# Patient Record
Sex: Female | Born: 1950 | Race: White | Hispanic: No | Smoking: Current every day smoker
Health system: Southern US, Community
[De-identification: ages and names within clinical notes are randomized; demographics above are authoritative.]

## PROBLEM LIST (undated history)

## (undated) DIAGNOSIS — IMO0001 Reserved for inherently not codable concepts without codable children: Secondary | ICD-10-CM

## (undated) DIAGNOSIS — J449 Chronic obstructive pulmonary disease, unspecified: Secondary | ICD-10-CM

## (undated) DIAGNOSIS — F1010 Alcohol abuse, uncomplicated: Secondary | ICD-10-CM

## (undated) DIAGNOSIS — I5030 Unspecified diastolic (congestive) heart failure: Secondary | ICD-10-CM

## (undated) DIAGNOSIS — J40 Bronchitis, not specified as acute or chronic: Secondary | ICD-10-CM

## (undated) HISTORY — PX: CYST REMOVAL HAND: SHX6279

## (undated) HISTORY — PX: ABDOMINAL HYSTERECTOMY: SHX81

---

## 2005-07-08 ENCOUNTER — Emergency Department: Payer: Self-pay | Admitting: General Practice

## 2005-09-14 ENCOUNTER — Emergency Department: Payer: Self-pay | Admitting: Emergency Medicine

## 2006-01-10 ENCOUNTER — Inpatient Hospital Stay: Payer: Self-pay | Admitting: Psychiatry

## 2006-04-12 ENCOUNTER — Emergency Department: Payer: Self-pay | Admitting: Emergency Medicine

## 2006-06-12 ENCOUNTER — Inpatient Hospital Stay: Payer: Self-pay | Admitting: Internal Medicine

## 2006-08-07 ENCOUNTER — Inpatient Hospital Stay: Payer: Self-pay | Admitting: Unknown Physician Specialty

## 2007-05-07 ENCOUNTER — Emergency Department: Payer: Self-pay | Admitting: Emergency Medicine

## 2009-07-21 ENCOUNTER — Emergency Department: Payer: Self-pay | Admitting: Emergency Medicine

## 2010-07-22 ENCOUNTER — Emergency Department: Payer: Self-pay | Admitting: Emergency Medicine

## 2011-02-15 ENCOUNTER — Emergency Department: Payer: Self-pay | Admitting: Emergency Medicine

## 2012-05-26 ENCOUNTER — Emergency Department: Payer: Self-pay | Admitting: Unknown Physician Specialty

## 2015-12-06 ENCOUNTER — Encounter: Payer: Self-pay | Admitting: *Deleted

## 2015-12-06 ENCOUNTER — Emergency Department: Payer: Self-pay

## 2015-12-06 ENCOUNTER — Inpatient Hospital Stay
Admission: EM | Admit: 2015-12-06 | Discharge: 2015-12-07 | DRG: 189 | Disposition: A | Discharge status: Home / Self Care | Payer: Self-pay | Attending: Internal Medicine | Admitting: Internal Medicine

## 2015-12-06 DIAGNOSIS — E872 Acidosis, unspecified: Secondary | ICD-10-CM

## 2015-12-06 DIAGNOSIS — R1310 Dysphagia, unspecified: Secondary | ICD-10-CM | POA: Diagnosis present

## 2015-12-06 DIAGNOSIS — R7301 Impaired fasting glucose: Secondary | ICD-10-CM | POA: Diagnosis present

## 2015-12-06 DIAGNOSIS — F17210 Nicotine dependence, cigarettes, uncomplicated: Secondary | ICD-10-CM | POA: Diagnosis present

## 2015-12-06 DIAGNOSIS — Z9889 Other specified postprocedural states: Secondary | ICD-10-CM

## 2015-12-06 DIAGNOSIS — R0902 Hypoxemia: Secondary | ICD-10-CM

## 2015-12-06 DIAGNOSIS — Z808 Family history of malignant neoplasm of other organs or systems: Secondary | ICD-10-CM

## 2015-12-06 DIAGNOSIS — J9601 Acute respiratory failure with hypoxia: Principal | ICD-10-CM | POA: Diagnosis present

## 2015-12-06 DIAGNOSIS — J9602 Acute respiratory failure with hypercapnia: Secondary | ICD-10-CM | POA: Diagnosis present

## 2015-12-06 DIAGNOSIS — J441 Chronic obstructive pulmonary disease with (acute) exacerbation: Secondary | ICD-10-CM | POA: Diagnosis present

## 2015-12-06 DIAGNOSIS — R0689 Other abnormalities of breathing: Secondary | ICD-10-CM

## 2015-12-06 DIAGNOSIS — Z90710 Acquired absence of both cervix and uterus: Secondary | ICD-10-CM

## 2015-12-06 HISTORY — DX: Reserved for inherently not codable concepts without codable children: IMO0001

## 2015-12-06 HISTORY — DX: Chronic obstructive pulmonary disease, unspecified: J44.9

## 2015-12-06 HISTORY — DX: Bronchitis, not specified as acute or chronic: J40

## 2015-12-06 LAB — URINALYSIS COMPLETE WITH MICROSCOPIC (ARMC ONLY)
BACTERIA UA: NONE SEEN
Bilirubin Urine: NEGATIVE
GLUCOSE, UA: NEGATIVE mg/dL
Ketones, ur: NEGATIVE mg/dL
LEUKOCYTES UA: NEGATIVE
NITRITE: NEGATIVE
PH: 6 (ref 5.0–8.0)
PROTEIN: NEGATIVE mg/dL
Specific Gravity, Urine: >1.06 — ABNORMAL HIGH (ref 1.005–1.030)

## 2015-12-06 LAB — CBC WITH DIFFERENTIAL/PLATELET
BASOS ABS: 0.1 10*3/uL (ref 0–0.1)
BASOS PCT: 1 %
EOS PCT: 1 %
Eosinophils Absolute: 0.1 10*3/uL (ref 0–0.7)
HCT: 46 % (ref 35.0–47.0)
Hemoglobin: 15.3 g/dL (ref 12.0–16.0)
Lymphocytes Relative: 11 %
Lymphs Abs: 0.9 10*3/uL — ABNORMAL LOW (ref 1.0–3.6)
MCH: 36.1 pg — ABNORMAL HIGH (ref 26.0–34.0)
MCHC: 33.1 g/dL (ref 32.0–36.0)
MCV: 109 fL — ABNORMAL HIGH (ref 80.0–100.0)
MONO ABS: 0.7 10*3/uL (ref 0.2–0.9)
Monocytes Relative: 9 %
NEUTROS ABS: 6.1 10*3/uL (ref 1.4–6.5)
Neutrophils Relative %: 78 %
PLATELETS: 279 10*3/uL (ref 150–440)
RBC: 4.22 MIL/uL (ref 3.80–5.20)
RDW: 17.4 % — AB (ref 11.5–14.5)
WBC: 7.8 10*3/uL (ref 3.6–11.0)

## 2015-12-06 LAB — INFLUENZA PANEL BY PCR (TYPE A & B)
H1N1FLUPCR: NOT DETECTED
INFLBPCR: NEGATIVE
Influenza A By PCR: NEGATIVE

## 2015-12-06 LAB — BLOOD GAS, VENOUS
Acid-Base Excess: 1.2 mmol/L (ref 0.0–3.0)
Acid-Base Excess: 3.3 mmol/L — ABNORMAL HIGH (ref 0.0–3.0)
BICARBONATE: 30.5 meq/L — AB (ref 21.0–28.0)
Bicarbonate: 33.3 mEq/L — ABNORMAL HIGH (ref 21.0–28.0)
PATIENT TEMPERATURE: 37
PCO2 VEN: 68 mmHg — AB (ref 44.0–60.0)
Patient temperature: 37
pCO2, Ven: 76 mmHg (ref 44.0–60.0)
pH, Ven: 7.26 — ABNORMAL LOW (ref 7.320–7.430)
pH, Ven: 7.25 — ABNORMAL LOW (ref 7.320–7.430)
pO2, Ven: <31 mmHg — ABNORMAL LOW (ref 31.0–45.0)

## 2015-12-06 LAB — RAPID INFLUENZA A&B ANTIGENS (ARMC ONLY)
INFLUENZA A (ARMC): NEGATIVE
INFLUENZA B (ARMC): NEGATIVE

## 2015-12-06 LAB — COMPREHENSIVE METABOLIC PANEL
ALBUMIN: 3.2 g/dL — AB (ref 3.5–5.0)
ALT: 17 U/L (ref 14–54)
AST: 20 U/L (ref 15–41)
Alkaline Phosphatase: 297 U/L — ABNORMAL HIGH (ref 38–126)
Anion gap: 9 (ref 5–15)
BUN: 6 mg/dL (ref 6–20)
CHLORIDE: 103 mmol/L (ref 101–111)
CO2: 26 mmol/L (ref 22–32)
Calcium: 8.3 mg/dL — ABNORMAL LOW (ref 8.9–10.3)
Creatinine, Ser: 0.53 mg/dL (ref 0.44–1.00)
Glucose, Bld: 120 mg/dL — ABNORMAL HIGH (ref 65–99)
POTASSIUM: 3.6 mmol/L (ref 3.5–5.1)
Sodium: 138 mmol/L (ref 135–145)
Total Bilirubin: 0.4 mg/dL (ref 0.3–1.2)
Total Protein: 6.6 g/dL (ref 6.5–8.1)

## 2015-12-06 LAB — GLUCOSE, CAPILLARY
GLUCOSE-CAPILLARY: 130 mg/dL — AB (ref 65–99)
GLUCOSE-CAPILLARY: 179 mg/dL — AB (ref 65–99)
Glucose-Capillary: 197 mg/dL — ABNORMAL HIGH (ref 65–99)

## 2015-12-06 LAB — TROPONIN I

## 2015-12-06 LAB — BRAIN NATRIURETIC PEPTIDE: B Natriuretic Peptide: 139 pg/mL — ABNORMAL HIGH (ref 0.0–100.0)

## 2015-12-06 MED ORDER — METHYLPREDNISOLONE SODIUM SUCC 125 MG IJ SOLR
125.0000 mg | Freq: Once | INTRAMUSCULAR | Status: AC
  Administered 2015-12-06: 125 mg via INTRAVENOUS
  Filled 2015-12-06: qty 2

## 2015-12-06 MED ORDER — IPRATROPIUM-ALBUTEROL 0.5-2.5 (3) MG/3ML IN SOLN
3.0000 mL | Freq: Four times a day (QID) | RESPIRATORY_TRACT | Status: DC
  Administered 2015-12-06 – 2015-12-07 (×4): 3 mL via RESPIRATORY_TRACT
  Filled 2015-12-06 (×5): qty 3

## 2015-12-06 MED ORDER — NYSTATIN 100000 UNIT/ML MT SUSP
5.0000 mL | Freq: Four times a day (QID) | OROMUCOSAL | Status: DC
Start: 2015-12-06 — End: 2015-12-07
  Administered 2015-12-06 – 2015-12-07 (×4): 500000 [IU] via ORAL
  Filled 2015-12-06 (×4): qty 5

## 2015-12-06 MED ORDER — IOHEXOL 350 MG/ML SOLN
60.0000 mL | Freq: Once | INTRAVENOUS | Status: AC | PRN
  Administered 2015-12-06: 60 mL via INTRAVENOUS

## 2015-12-06 MED ORDER — AZITHROMYCIN 250 MG PO TABS
500.0000 mg | ORAL_TABLET | Freq: Every day | ORAL | Status: AC
  Administered 2015-12-06: 500 mg via ORAL
  Filled 2015-12-06: qty 2

## 2015-12-06 MED ORDER — INSULIN ASPART 100 UNIT/ML ~~LOC~~ SOLN: 0.0000 [IU] | Freq: Every day | SUBCUTANEOUS | Status: DC

## 2015-12-06 MED ORDER — SODIUM CHLORIDE 0.9 % IV BOLUS (SEPSIS)
1000.0000 mL | Freq: Once | INTRAVENOUS | Status: AC
  Administered 2015-12-06: 1000 mL via INTRAVENOUS

## 2015-12-06 MED ORDER — ACETAMINOPHEN 650 MG RE SUPP: 650.0000 mg | Freq: Four times a day (QID) | RECTAL | Status: DC | PRN

## 2015-12-06 MED ORDER — METHYLPREDNISOLONE SODIUM SUCC 125 MG IJ SOLR
60.0000 mg | Freq: Four times a day (QID) | INTRAMUSCULAR | Status: DC
  Administered 2015-12-06 – 2015-12-07 (×4): 60 mg via INTRAVENOUS
  Filled 2015-12-06 (×4): qty 2

## 2015-12-06 MED ORDER — INSULIN ASPART 100 UNIT/ML ~~LOC~~ SOLN
0.0000 [IU] | Freq: Three times a day (TID) | SUBCUTANEOUS | Status: DC
  Administered 2015-12-06: 2 [IU] via SUBCUTANEOUS
  Administered 2015-12-07 (×2): 3 [IU] via SUBCUTANEOUS
  Filled 2015-12-06: qty 1
  Filled 2015-12-06: qty 3
  Filled 2015-12-06: qty 2
  Filled 2015-12-06: qty 3

## 2015-12-06 MED ORDER — BUDESONIDE 0.25 MG/2ML IN SUSP
0.2500 mg | Freq: Two times a day (BID) | RESPIRATORY_TRACT | Status: DC
  Administered 2015-12-06 – 2015-12-07 (×2): 0.25 mg via RESPIRATORY_TRACT
  Filled 2015-12-06 (×2): qty 2

## 2015-12-06 MED ORDER — AZITHROMYCIN 250 MG PO TABS
250.0000 mg | ORAL_TABLET | Freq: Every day | ORAL | Status: DC
  Administered 2015-12-07: 250 mg via ORAL
  Filled 2015-12-06: qty 1

## 2015-12-06 MED ORDER — ENOXAPARIN SODIUM 40 MG/0.4ML ~~LOC~~ SOLN
40.0000 mg | SUBCUTANEOUS | Status: DC
  Administered 2015-12-06: 40 mg via SUBCUTANEOUS
  Filled 2015-12-06 (×2): qty 0.4

## 2015-12-06 MED ORDER — TRAMADOL HCL 50 MG PO TABS
50.0000 mg | ORAL_TABLET | Freq: Four times a day (QID) | ORAL | Status: DC | PRN
  Administered 2015-12-07: 50 mg via ORAL
  Filled 2015-12-06: qty 1

## 2015-12-06 MED ORDER — NICOTINE 21 MG/24HR TD PT24
21.0000 mg | MEDICATED_PATCH | Freq: Every day | TRANSDERMAL | Status: DC
Start: 2015-12-06 — End: 2015-12-07
  Administered 2015-12-06 – 2015-12-07 (×2): 21 mg via TRANSDERMAL
  Filled 2015-12-06 (×2): qty 1

## 2015-12-06 MED ORDER — IPRATROPIUM-ALBUTEROL 0.5-2.5 (3) MG/3ML IN SOLN
3.0000 mL | Freq: Once | RESPIRATORY_TRACT | Status: AC
  Administered 2015-12-06: 3 mL via RESPIRATORY_TRACT
  Filled 2015-12-06: qty 3

## 2015-12-06 MED ORDER — ACETAMINOPHEN 325 MG PO TABS
650.0000 mg | ORAL_TABLET | Freq: Four times a day (QID) | ORAL | Status: DC | PRN
  Administered 2015-12-06 – 2015-12-07 (×2): 650 mg via ORAL
  Filled 2015-12-06 (×2): qty 2

## 2015-12-06 NOTE — ED Provider Notes (Signed)
[facility] Emergency Department Provider Note  ____________________________________________  Time seen: Approximately 11:23 AM  I have reviewed the triage vital signs and the nursing notes.   HISTORY  Chief Complaint Shortness of Breath    HPI Rose Torres is a 65 y.o. female with a history of COPD and ongoing tobacco abuse presenting with 3-4 weeks of generalized weakness and shortness of breath. Patient reports that over the past several weeks she has as a progressively worsening exertional shortness of breath. She denies any associated cough, rhinorrhea, congestion, or ear pain. Several nights ago she had chills, but has not had any known fever. She occasionally also has episodes of bilateral chest "tightness" around the lateral chest walls that lasts for 2-3 hours and resolved with rest. No nausea, vomiting, diarrhea, abdominal pain, palpitations or syncope.   Past Medical History  Diagnosis Date  . COPD (chronic obstructive pulmonary disease) (HCC)   . Bronchitis     There are no active problems to display for this patient.   History reviewed. No pertinent past surgical history.  Current Outpatient Rx  Name  Route  Sig  Dispense  Refill  . ibuprofen (ADVIL,MOTRIN) 200 MG tablet   Oral   Take 200 mg by mouth every 6 (six) hours as needed.           Allergies Review of patient's allergies indicates no known allergies.  History reviewed. No pertinent family history.  Social History Social History  Substance Use Topics  . Smoking status: Current Every Day Smoker  . Smokeless tobacco: None  . Alcohol Use: None    Review of Systems Constitutional: Positive chills with no fever. No syncope. No lightheadedness. Also of generalized weakness. Eyes: No visual changes. No eye discharge. ENT: No sore throat. No congestion or rhinorrhea. Cardiovascular: Positive bilateral chest "tightness." No palpitations. Respiratory: Positive  exertional shortness of breath.  No cough. Gastrointestinal: No abdominal pain.  No nausea, no vomiting.  No diarrhea.  No constipation. Genitourinary: Negative for dysuria. Musculoskeletal: Negative for back pain. No calf pain or leg pain. No lower extremity swelling. Skin: Negative for rash. Neurological: Negative for headaches, focal weakness or numbness.  10-point ROS otherwise negative.  ____________________________________________   PHYSICAL EXAM:  VITAL SIGNS: ED Triage Vitals  Enc Vitals Group     BP 12/06/15 1100 94/47 mmHg     Pulse Rate 12/06/15 1100 99     Resp 12/06/15 1100 18     Temp 12/06/15 1100 98.1 F (36.7 C)     Temp Source 12/06/15 1100 Oral     SpO2 12/06/15 1100 84 %     Weight 12/06/15 1100 102 lb (46.267 kg)     Height 12/06/15 1100  (1.549 m)     Head Cir --      Peak Flow --      Pain Score 12/06/15 1101 7     Pain Loc --      Pain Edu? --      Excl. in GC? --     Constitutional: Patient is chronically ill appearing but alert and oriented and answering questions appropriately. She is mildly uncomfortable but in no acute distress. Nontoxic.  Eyes: Conjunctivae are normal.  EOMI. no eye discharge. No scleral icterus. Head: Atraumatic. Nose: No congestion/rhinnorhea. Mouth/Throat: Mucous membranes are moist.  Neck: No stridor.  Supple.  No JVD. No meningismus. Cardiovascular: Fast rate, regular rhythm. No murmurs, rubs or gallops.  Respiratory: Patient is tachypnea but able to  speak in 4-5 word sentences. Minimal end expiratory wheezing bilaterally which is best heard on the anterior auscultation exam. No rales or rhonchi. Increased expiratory phase. Positive bilateral breath sounds. Gastrointestinal: Soft and nontender. No distention. No peritoneal signs. Musculoskeletal: No LE edema. No calf tenderness or palpable cords. Negative Homans sign. Neurologic:  Normal speech and language. No gross focal neurologic deficits are appreciated.  Skin:   Skin is warm, dry and intact. No rash noted. Psychiatric: Mood and affect are normal. Speech and behavior are normal.  Normal judgement.  ____________________________________________   LABS (all labs ordered are listed, but only abnormal results are displayed)  Labs Reviewed  CBC WITH DIFFERENTIAL/PLATELET - Abnormal; Notable for the following:    MCV 109.0 (*)    MCH 36.1 (*)    RDW 17.4 (*)    Lymphs Abs 0.9 (*)    All other components within normal limits  COMPREHENSIVE METABOLIC PANEL - Abnormal; Notable for the following:    Glucose, Bld 120 (*)    Calcium 8.3 (*)    Albumin 3.2 (*)    Alkaline Phosphatase 297 (*)    All other components within normal limits  BLOOD GAS, VENOUS - Abnormal; Notable for the following:    pH, Ven 7.26 (*)    pCO2, Ven 68 (*)    pO2, Ven <31.0 (*)    Bicarbonate 30.5 (*)    All other components within normal limits  BRAIN NATRIURETIC PEPTIDE - Abnormal; Notable for the following:    B Natriuretic Peptide 139.0 (*)    All other components within normal limits  RAPID INFLUENZA A&B ANTIGENS ([facility] ONLY)  CULTURE, BLOOD (ROUTINE X 2)  CULTURE, BLOOD (ROUTINE X 2)  TROPONIN I  URINALYSIS COMPLETEWITH MICROSCOPIC ([facility] ONLY)   ____________________________________________  EKG  ED ECG REPORT I, Rockne Menghini, the attending physician, personally viewed and interpreted this ECG.   Date: 12/06/2015  EKG Time: 1129  Rate: 91  Rhythm: normal sinus rhythm  Axis: Normal  Intervals:none  ST&T Change: Nonspecific T-wave inversion in V1 and V2. No ST elevation. Positive PVC.  ____________________________________________  RADIOLOGY  Dg Chest 2 View  12/06/2015  CLINICAL DATA:  Shortness of breath.  COPD. EXAM: CHEST  2 VIEW COMPARISON:  02/15/2011 FINDINGS: The heart size and pulmonary vascularity are normal. No infiltrates or effusions. Chronic accentuation of the interstitial markings. The lungs are somewhat hyperinflated with  flattening of the diaphragm consistent with COPD. Slight calcification in the thoracic aorta. Bones are normal. IMPRESSION: Chronic lung disease.  No acute abnormalities. Electronically Signed   By: Francene Boyers M.D.   On: 12/06/2015 11:56   Ct Angio Chest Pe W/cm &/or Wo Cm  12/06/2015  CLINICAL DATA:  Progressive shortness of breath for 1 month, history of COPD, smoker EXAM: CT ANGIOGRAPHY CHEST WITH CONTRAST TECHNIQUE: Multidetector CT imaging of the chest was performed using the standard protocol during bolus administration of intravenous contrast. Multiplanar CT image reconstructions and MIPs were obtained to evaluate the vascular anatomy. CONTRAST:  60mL OMNIPAQUE IOHEXOL 350 MG/ML SOLN COMPARISON:  None. FINDINGS: Mediastinum/Lymph Nodes: Heart size within normal limits. No pericardial effusion. There is mild thickening of distal esophageal wall please see axial image 90. Gastroesophageal reflux disease cannot be excluded. The study is of excellent technical quality. There is no evidence of pulmonary embolus. No mediastinal hematoma or adenopathy. No aortic aneurysm. Mild atherosclerotic calcifications of thoracic aorta. There is no hilar adenopathy. Lungs/Pleura: Hyperinflation is noted. Mild emphysematous changes with centrilobular and paraseptal  emphysematous bullae bilateral upper lobe. No segmental infiltrate or pulmonary edema. No focal consolidation. Mild perihilar bronchitic changes are noted bilaterally. There is atelectasis or scarring left base anterolaterally. Small scarring noted in lingula. Mild emphysematous changes bilateral upper lobes scattered mild emphysematous changes bilateral lower lobes. There is no pleural thickening. No pleural effusion. Upper abdomen: Visualized liver shows no biliary ductal dilatation. Visualized tail of the pancreas is unremarkable. No adrenal gland mass is noted. Musculoskeletal: Sagittal images of the spine shows mild degenerative changes mid and lower  thoracic spine. Sagittal view of the sternum is unremarkable. Review of the MIP images confirms the above findings. IMPRESSION: 1. No pulmonary embolus is noted. 2. No mediastinal hematoma or adenopathy. 3. There is mild thickening of distal esophageal wall. Clinical correlation is necessary to exclude gastroesophageal reflux disease. 4. Hyperinflation is noted. Emphysematous changes are noted bilateral upper lobes. Minimal emphysematous changes are noted bilateral lower lobes. No segmental infiltrate or pulmonary edema. Mild perihilar bronchitic changes. Mild scarring lingula. Mild atelectasis or scarring in left base anterolaterally. Electronically Signed   By: Natasha Mead M.D.   On: 12/06/2015 12:57    ____________________________________________   PROCEDURES  Procedure(s) performed: None  Critical Care performed: Yes, see note ____________________________________________   INITIAL IMPRESSION / ASSESSMENT AND PLAN / ED COURSE  Pertinent labs & imaging results that were available during my care of the patient were reviewed by me and considered in my medical decision making (see chart for details).  65 y.o. female with COPD who does not wear oxygen at home presenting with an O2 sat as low as 78% with a good tracing. The patient also has an initial blood pressure which is hypotensive and associated tachycardia in the low 100s. She does not have a fever here and has not been having a fever at home. There are multiple possible etiologies for her symptoms. I would consider progression of her COPD or emphysema, and we will also evaluate for an acute infection although she has not been having a cough or fever. The patient has no signs or symptoms of DVT, but I would also be concerned for PE. A cardiac etiology is less likely given her risk factors, but we will also evaluate for ischemia, ACS or MI. The patient will require admission to the hospital after her initial evaluation in the emergency  department is complete.  ----------------------------------------- 12:53 PM on 12/06/2015 ----------------------------------------- The patient has a chest x-ray which shows no acute cardiopulmonary abnormalities. Her EKG does not show any ischemic changes. She does, however, have a gas which shows some mild acidemia with hypercarbia and hypoxia. Her BNP is is mildly elevated, but she has no evidence of pulmonary edema, or peripheral edema, and does not have any history of CHF. It is unlikely that this is driving her hypoxia. Her flu examinations are negative. She does not have an elevated white blood cell count, nor an infiltrate on her x-ray. At this time, I am awaiting the results of her CT for final disposition  CRITICAL CARE Performed by: Rockne Menghini   Total critical care time: 35 minutes  Critical care time was exclusive of separately billable procedures and treating other patients.  Critical care was necessary to treat or prevent imminent or life-threatening deterioration.  Critical care was time spent personally by me on the following activities: development of treatment plan with patient and/or surrogate as well as nursing, discussions with consultants, evaluation of patient's response to treatment, examination of patient, obtaining history from patient  or surrogate, ordering and performing treatments and interventions, ordering and review of laboratory studies, ordering and review of radiographic studies, pulse oximetry and re-evaluation of patient's condition.  ----------------------------------------- 1:09 PM on 12/06/2015 -----------------------------------------  The patient's CT scan does not show any evidence of pulmonary embolus, nor does she have any evidence of infiltrate. She does have diffuse emphysematous changes. I will plan to treat the patient for an acute COPD exacerbation with steroids and nebulizers. She will require hospitalization for her new hypoxia,  she has not required oxygen in the past.  ____________________________________________  FINAL CLINICAL IMPRESSION(S) / ED DIAGNOSES  Final diagnoses:  Hypoxia  Hypercarbia  Acidemia  COPD exacerbation (HCC)      NEW MEDICATIONS STARTED DURING THIS VISIT:  New Prescriptions   No medications on file     Rockne MenghiniAnne-Caroline Schwanda Zima, MD 12/06/15 1310

## 2015-12-06 NOTE — H&P (Signed)
[facility] - [place] at [facility]   PATIENT NAME: Marcille Barman    MR#:  [id number]  DATE OF BIRTH:  1951/03/02  DATE OF ADMISSION:  12/06/2015  PRIMARY CARE PHYSICIAN: [facility]  REQUESTING/REFERRING PHYSICIAN: Dr. Virgilio Frees  CHIEF COMPLAINT:   Chief Complaint  Patient presents with  . Shortness of Breath    HISTORY OF PRESENT ILLNESS:  Elisheba Mcdonnell  is a 65 y.o. female with a known history of COPD. She presents with 3 weeks of shortness of breath and fatigue. She states that she's been weaker and weaker and she can hardly walk across the room. She's been using her husband's albuterol without any improvement. Not much cough or wheeze. She does have rib soreness bilaterally. She's also had some recent dysphagia to solids with the acute illness. In the ER, her pulse ox was 84% on room air and hospitalist services were contacted for further evaluation.  PAST MEDICAL HISTORY:   Past Medical History  Diagnosis Date  . COPD (chronic obstructive pulmonary disease) (HCC)   . Bronchitis     PAST SURGICAL HISTORY:   Past Surgical History  Procedure Laterality Date  . Abdominal hysterectomy    . Cyst removal hand      SOCIAL HISTORY:   Social History  Substance Use Topics  . Smoking status: Current Every Day Smoker -- 0.50 packs/day  . Smokeless tobacco: Not on file  . Alcohol Use: No    FAMILY HISTORY:   Family History  Problem Relation Age of Onset  . Healthy Mother   . Melanoma Father     DRUG ALLERGIES:  No Known Allergies  REVIEW OF SYSTEMS:  CONSTITUTIONAL: No fever.  positive for fatigue. positive for chills one night. Positive for weight loss. Decreased appetite.  EYES: No blurred or double vision.  wears contacts.  EARS, NOSE, AND THROAT: No tinnitus or ear pain. No sore throat. Dysphasia to solids which has been recent. Decreased hearing.  RESPIRATORY: No cough,  positive for shortness of breath,  no  wheezing or hemoptysis.  CARDIOVASCULAR: No chest pain, positive for rib pain. No  orthopnea, edema.  GASTROINTESTINAL: No nausea, vomiting, diarrhea or abdominal pain. No blood in bowel movements. Positive for constipation.  GENITOURINARY: No dysuria, hematuria.  ENDOCRINE: No polyuria, nocturia,  HEMATOLOGY: No anemia, easy bruising or bleeding SKIN: No rash or lesion. MUSCULOSKELETAL: positive joint pains all over. Positive for rib pain bilaterally.   NEUROLOGIC: No tingling, numbness, weakness.  PSYCHIATRY: Positive for  anxiety  and depression.  no thoughts of hurting herself or other people.  MEDICATIONS AT HOME:   Prior to Admission medications   Medication Sig Start Date End Date Taking? Authorizing Provider  ibuprofen (ADVIL,MOTRIN) 200 MG tablet Take 200 mg by mouth every 6 (six) hours as needed.   Yes Historical Provider, MD      VITAL SIGNS:  Blood pressure 113/67, pulse 85, temperature 98.1 F (36.7 C), temperature source Oral, resp. rate 12, height  (1.549 m), weight 46.267 kg (102 lb), SpO2 99 %.  PHYSICAL EXAMINATION:  GENERAL:  65 y.o.-year-old patient lying in the bed with no acute distress.  EYES: Pupils equal, round, reactive to light and accommodation. No scleral icterus. Extraocular muscles intact.  HEENT: Head atraumatic, normocephalic. Oropharynx and nasopharynx clear.  tongue has some whitish area on it and also an area of raw looking tongue.  NECK:  Supple, no jugular venous distention. No thyroid enlargement, no tenderness.  LUNGS: decreased  breath sounds bilaterally, no wheezing, rales,rhonchi or crepitation. No use of accessory muscles of respiration.  CARDIOVASCULAR: S1, S2 normal. No murmurs, rubs, or gallops.  ABDOMEN: Soft, nontender, nondistended. Bowel sounds present. No organomegaly or mass.  EXTREMITIES: No pedal edema, cyanosis, or clubbing.  NEUROLOGIC: Cranial nerves II through XII are intact. Muscle strength 5/5 in all extremities.  Sensation intact. Gait not checked.  PSYCHIATRIC: The patient is alert and oriented x 3.  SKIN: No rash, lesion, or ulcer.   LABORATORY PANEL:   CBC  Recent Labs Lab 12/06/15 1100  WBC 7.8  HGB 15.3  HCT 46.0  PLT 279   ------------------------------------------------------------------------------------------------------------------  Chemistries   Recent Labs Lab 12/06/15 1100  NA 138  K 3.6  CL 103  CO2 26  GLUCOSE 120*  BUN 6  CREATININE 0.53  CALCIUM 8.3*  AST 20  ALT 17  ALKPHOS 297*  BILITOT 0.4   ------------------------------------------------------------------------------------------------------------------  Cardiac Enzymes  Recent Labs Lab 12/06/15 1100  TROPONINI <0.03   ------------------------------------------------------------------------------------------------------------------  RADIOLOGY:  Dg Chest 2 View  12/06/2015  CLINICAL DATA:  Shortness of breath.  COPD. EXAM: CHEST  2 VIEW COMPARISON:  02/15/2011 FINDINGS: The heart size and pulmonary vascularity are normal. No infiltrates or effusions. Chronic accentuation of the interstitial markings. The lungs are somewhat hyperinflated with flattening of the diaphragm consistent with COPD. Slight calcification in the thoracic aorta. Bones are normal. IMPRESSION: Chronic lung disease.  No acute abnormalities. Electronically Signed   By: Francene BoyersJames  Maxwell M.D.   On: 12/06/2015 11:56   Ct Angio Chest Pe W/cm &/or Wo Cm  12/06/2015  CLINICAL DATA:  Progressive shortness of breath for 1 month, history of COPD, smoker EXAM: CT ANGIOGRAPHY CHEST WITH CONTRAST TECHNIQUE: Multidetector CT imaging of the chest was performed using the standard protocol during bolus administration of intravenous contrast. Multiplanar CT image reconstructions and MIPs were obtained to evaluate the vascular anatomy. CONTRAST:  60mL OMNIPAQUE IOHEXOL 350 MG/ML SOLN COMPARISON:  None. FINDINGS: Mediastinum/Lymph Nodes: Heart size within  normal limits. No pericardial effusion. There is mild thickening of distal esophageal wall please see axial image 90. Gastroesophageal reflux disease cannot be excluded. The study is of excellent technical quality. There is no evidence of pulmonary embolus. No mediastinal hematoma or adenopathy. No aortic aneurysm. Mild atherosclerotic calcifications of thoracic aorta. There is no hilar adenopathy. Lungs/Pleura: Hyperinflation is noted. Mild emphysematous changes with centrilobular and paraseptal emphysematous bullae bilateral upper lobe. No segmental infiltrate or pulmonary edema. No focal consolidation. Mild perihilar bronchitic changes are noted bilaterally. There is atelectasis or scarring left base anterolaterally. Small scarring noted in lingula. Mild emphysematous changes bilateral upper lobes scattered mild emphysematous changes bilateral lower lobes. There is no pleural thickening. No pleural effusion. Upper abdomen: Visualized liver shows no biliary ductal dilatation. Visualized tail of the pancreas is unremarkable. No adrenal gland mass is noted. Musculoskeletal: Sagittal images of the spine shows mild degenerative changes mid and lower thoracic spine. Sagittal view of the sternum is unremarkable. Review of the MIP images confirms the above findings. IMPRESSION: 1. No pulmonary embolus is noted. 2. No mediastinal hematoma or adenopathy. 3. There is mild thickening of distal esophageal wall. Clinical correlation is necessary to exclude gastroesophageal reflux disease. 4. Hyperinflation is noted. Emphysematous changes are noted bilateral upper lobes. Minimal emphysematous changes are noted bilateral lower lobes. No segmental infiltrate or pulmonary edema. Mild perihilar bronchitic changes. Mild scarring lingula. Mild atelectasis or scarring in left base anterolaterally. Electronically Signed   By: Lang SnowLiviu  Pop M.D.   On: 12/06/2015 12:57    IMPRESSION AND PLAN:   1. Acute respiratory failure with hypoxia  and hypercarbia. Pulse ox 84% on room air. Give oxygen supplementation. Hopefully we'll be able to get better air entry and get her off oxygen prior to discharge home.  2. COPD exacerbation. IV Solu-Medrol 60 mg IV every 6 hours. Budesonide and DuoNeb nebulizers. By mouth Zithromax. 3. Respiratory acidosis on blood gas. Try to get better air entry. 4. Questionable thrush. nystatin swish and swallow. 5. Thickening of distal esophagus and dysphagia symptoms. We'll give IV Protonix just in case this is acid reflux. Respiratory status needs to get better for any GI evaluation.  6. Impaired fasting glucose. Sugars will be high on steroids. Put on sliding scale for right now and check a hemoglobin A1c.  All the records are reviewed and case discussed with ED provider. Management plans discussed with the patient, family and they are in agreement.  CODE STATUS: full code  TOTAL TIME TAKING CARE OF THIS PATIENT: 50 minutes.    Alford Highland M.D on 12/06/2015 at 1:52 PM  Between 7am to 6pm - Pager - [phone]  After 6pm call admission pager [phone]  [facility]  Office  [phone]  CC: Primary care physician; [facility] health clinic

## 2015-12-06 NOTE — ED Notes (Signed)
States SOB that began about 1 month ago worsening, smoker, pt awake and alert

## 2015-12-07 LAB — BASIC METABOLIC PANEL
Anion gap: 7 (ref 5–15)
BUN: 7 mg/dL (ref 6–20)
CALCIUM: 8.1 mg/dL — AB (ref 8.9–10.3)
CO2: 26 mmol/L (ref 22–32)
Chloride: 100 mmol/L — ABNORMAL LOW (ref 101–111)
Creatinine, Ser: 0.56 mg/dL (ref 0.44–1.00)
GFR calc Af Amer: >60 mL/min (ref >60)
Glucose, Bld: 181 mg/dL — ABNORMAL HIGH (ref 65–99)
POTASSIUM: 4.6 mmol/L (ref 3.5–5.1)
SODIUM: 133 mmol/L — AB (ref 135–145)

## 2015-12-07 LAB — CBC
HEMATOCRIT: 44.5 % (ref 35.0–47.0)
Hemoglobin: 14.9 g/dL (ref 12.0–16.0)
MCH: 36.9 pg — ABNORMAL HIGH (ref 26.0–34.0)
MCHC: 33.5 g/dL (ref 32.0–36.0)
MCV: 110.3 fL — ABNORMAL HIGH (ref 80.0–100.0)
PLATELETS: 272 10*3/uL (ref 150–440)
RBC: 4.04 MIL/uL (ref 3.80–5.20)
RDW: 17.5 % — AB (ref 11.5–14.5)
WBC: 5 10*3/uL (ref 3.6–11.0)

## 2015-12-07 LAB — GLUCOSE, CAPILLARY
GLUCOSE-CAPILLARY: 164 mg/dL — AB (ref 65–99)
GLUCOSE-CAPILLARY: 232 mg/dL — AB (ref 65–99)
GLUCOSE-CAPILLARY: 209 mg/dL — AB (ref 65–99)
Glucose-Capillary: 198 mg/dL — ABNORMAL HIGH (ref 65–99)

## 2015-12-07 LAB — HEMOGLOBIN A1C: HEMOGLOBIN A1C: 4.7 % (ref 4.0–6.0)

## 2015-12-07 MED ORDER — PREDNISONE 50 MG PO TABS: ORAL_TABLET | ORAL | Status: DC

## 2015-12-07 MED ORDER — BUDESONIDE 0.25 MG/2ML IN SUSP: 0.2500 mg | Freq: Two times a day (BID) | RESPIRATORY_TRACT | Status: DC

## 2015-12-07 MED ORDER — IPRATROPIUM-ALBUTEROL 20-100 MCG/ACT IN AERS: 1.0000 | INHALATION_SPRAY | Freq: Four times a day (QID) | RESPIRATORY_TRACT | Status: DC | PRN

## 2015-12-07 MED ORDER — PREDNISONE 50 MG PO TABS: 50.0000 mg | ORAL_TABLET | Freq: Every day | ORAL | Status: DC

## 2015-12-07 MED ORDER — IPRATROPIUM-ALBUTEROL 0.5-2.5 (3) MG/3ML IN SOLN: 3.0000 mL | Freq: Four times a day (QID) | RESPIRATORY_TRACT | Status: DC | PRN

## 2015-12-07 MED ORDER — NICOTINE 21 MG/24HR TD PT24: 21.0000 mg | MEDICATED_PATCH | Freq: Every day | TRANSDERMAL | Status: DC

## 2015-12-07 NOTE — Discharge Instructions (Signed)
Stop smoking

## 2015-12-07 NOTE — Care Management Note (Signed)
Case Management Note  Patient Details  Name: Lavell Ridings MRN: [id number] Date of Birth: May 30, 1951  Subjective/Objective:                  Met with patient to discuss discharge planning. She states she lives a her husband. She is not on O2 at home.  She has a nebulizer at home that works. She goes to [facility] but does not remember her household income. She recently lost her health insurance. She had not heard of Medication Management and states her pulmonary Rx are too expensive. Patient may benefit from [facility] at discharge however that's a temporary, one-time fix. Patient is independent with mobility/drives.   Action/Plan: Application to Medication mgt delivered to patient.  Expected Discharge Date:                  Expected Discharge Plan:     In-House Referral:     Discharge planning Services  Medication Assistance  Post Acute Care Choice:    Choice offered to:  Patient  DME Arranged:    DME Agency:     HH Arranged:    [place] Agency:     Status of Service:  Completed, signed off  Medicare Important Message Given:    Date Medicare IM Given:    Medicare IM give by:    Date Additional Medicare IM Given:    Additional Medicare Important Message give by:     If discussed at [place] of Stay Meetings, dates discussed:    Additional Comments:  Marshell Garfinkel, RN 12/07/2015, 9:00 AM

## 2015-12-07 NOTE — Discharge Summary (Signed)
[facility] - [place] at [facility]   PATIENT NAME: Rose Torres    MR#:  [id number]  DATE OF BIRTH:  [birth year]  DATE OF ADMISSION:  12/06/2015 ADMITTING PHYSICIAN: Alford Highland, MD  DATE OF DISCHARGE: 12/07/2015  PRIMARY CARE PHYSICIAN: No primary care provider on file.    ADMISSION DIAGNOSIS:  Acidemia [E87.2] Hypercarbia [R06.89] Hypoxia [R09.02] COPD exacerbation (HCC) [J44.1]  DISCHARGE DIAGNOSIS:  Acute COPD exacerbation with hypoxia improved Ongoing tobacco abuse  SECONDARY DIAGNOSIS:   Past Medical History  Diagnosis Date  . COPD (chronic obstructive pulmonary disease) (HCC)   . Bronchitis   . Shortness of breath dyspnea     HOSPITAL COURSE:   1. Acute respiratory failure with hypoxia and hypercarbia. Pulse ox 84% on room air. Give oxygen supplementation. Hopefully we'll be able to get better air entry and get her off oxygen prior to discharge home.  Patient improved remarkably. Her sats are 95% on 2 L. We will wean her down to room air and assess for home oxygen need. 2. COPD exacerbation. IV Solu-Medrol 60 mg IV every 6 hours. Budesonide and DuoNeb nebulizers.  -Changed to steroid taper. -No indication for antibiotics. Chest x-ray negative for pneumonia no evidence of cough  3. Thickening of distal esophagus and dysphagia symptoms.  -Recommended over-the-counter Prilosec .Respiratory status needs to get better for any GI evaluation.  4. Impaired fasting glucose. Sugars will be high on steroids. Hemoglobin A1c is 4.7.  Patient counseled at length on smoking cessation. She'll be given nicotine patches. We'll assess her for home oxygen needs prior to discharge. CONSULTS OBTAINED:     DRUG ALLERGIES:  No Known Allergies  DISCHARGE MEDICATIONS:   Current Discharge Medication List    START taking these medications   Details  budesonide (PULMICORT) 0.25 MG/2ML nebulizer solution Take 2 mLs (0.25 mg total) by nebulization  2 (two) times daily. Qty: 60 mL, Refills: 12    Ipratropium-Albuterol (COMBIVENT) 20-100 MCG/ACT AERS respimat Inhale 1 puff into the lungs every 6 (six) hours as needed for wheezing. Qty: 1 Inhaler, Refills: 2    nicotine (NICODERM CQ - DOSED IN MG/24 HOURS) 21 mg/24hr patch Place 1 patch (21 mg total) onto the skin daily. Qty: 28 patch, Refills: 0    predniSONE (DELTASONE) 50 MG tablet Take 50 mg daily taper by 10 mg thens stop Qty: 15 tablet, Refills: 0      CONTINUE these medications which have NOT CHANGED   Details  ibuprofen (ADVIL,MOTRIN) 200 MG tablet Take 200 mg by mouth every 6 (six) hours as needed.        If you experience worsening of your admission symptoms, develop shortness of breath, life threatening emergency, suicidal or homicidal thoughts you must seek medical attention immediately by calling 911 or calling your MD immediately  if symptoms less severe.  You Must read complete instructions/literature along with all the possible adverse reactions/side effects for all the Medicines you take and that have been prescribed to you. Take any new Medicines after you have completely understood and accept all the possible adverse reactions/side effects.   Please note  You were cared for by a hospitalist during your hospital stay. If you have any questions about your discharge medications or the care you received while you were in the hospital after you are discharged, you can call the unit and asked to speak with the hospitalist on call if the hospitalist that took care of you is not available. Once you are discharged,  your primary care physician will handle any further medical issues. Please note that NO REFILLS for any discharge medications will be authorized once you are discharged, as it is imperative that you return to your primary care physician (or establish a relationship with a primary care physician if you do not have one) for your aftercare needs so that they can  reassess your need for medications and monitor your lab values. Today   SUBJECTIVE   Feels a whole lot better  VITAL SIGNS:  Blood pressure 101/52, pulse 105, temperature 97.8 F (36.6 C), temperature source Oral, resp. rate 16, height  (1.549 m), weight 46.267 kg (102 lb), SpO2 93 %.  I/O:   Intake/Output Summary (Last 24 hours) at 12/07/15 1258 Last data filed at 12/07/15 0900  Gross per 24 hour  Intake    630 ml  Output      0 ml  Net    630 ml    PHYSICAL EXAMINATION:  GENERAL:  65 y.o.-year-old patient lying in the bed with no acute distress.  EYES: Pupils equal, round, reactive to light and accommodation. No scleral icterus. Extraocular muscles intact.  HEENT: Head atraumatic, normocephalic. Oropharynx and nasopharynx clear.  NECK:  Supple, no jugular venous distention. No thyroid enlargement, no tenderness.  LUNGS: Normal breath sounds bilaterally, no wheezing, rales,rhonchi or crepitation. No use of accessory muscles of respiration.  CARDIOVASCULAR: S1, S2 normal. No murmurs, rubs, or gallops.  ABDOMEN: Soft, non-tender, non-distended. Bowel sounds present. No organomegaly or mass.  EXTREMITIES: No pedal edema, cyanosis, or clubbing.  NEUROLOGIC: Cranial nerves II through XII are intact. Muscle strength 5/5 in all extremities. Sensation intact. Gait not checked.  PSYCHIATRIC: The patient is alert and oriented x 3.  SKIN: No obvious rash, lesion, or ulcer.   DATA REVIEW:   CBC   Recent Labs Lab 12/07/15 0612  WBC 5.0  HGB 14.9  HCT 44.5  PLT 272    Chemistries   Recent Labs Lab 12/06/15 1100 12/07/15 0612  NA 138 133*  K 3.6 4.6  CL 103 100*  CO2 26 26  GLUCOSE 120* 181*  BUN 6 7  CREATININE 0.53 0.56  CALCIUM 8.3* 8.1*  AST 20  --   ALT 17  --   ALKPHOS 297*  --   BILITOT 0.4  --     Microbiology Results   Recent Results (from the past 240 hour(s))  Blood culture (routine x 2)     Status: None (Preliminary result)   Collection Time:  12/06/15 11:46 AM  Result Value Ref Range Status   Specimen Description BLOOD RIGHT ASSIST CONTROL  Final   Special Requests BOTTLES DRAWN AEROBIC AND ANAEROBIC  4CC  Final   Culture NO GROWTH < 24 HOURS  Final   Report Status PENDING  Incomplete  Blood culture (routine x 2)     Status: None (Preliminary result)   Collection Time: 12/06/15 11:46 AM  Result Value Ref Range Status   Specimen Description BLOOD RIGHT HAND  Final   Special Requests   Final    BOTTLES DRAWN AEROBIC AND ANAEROBIC  AERO 5CC ANA 3CC   Culture NO GROWTH < 24 HOURS  Final   Report Status PENDING  Incomplete  Rapid Influenza A&B Antigens ([facility] only)     Status: None   Collection Time: 12/06/15 11:46 AM  Result Value Ref Range Status   Influenza A ([facility]) NEGATIVE NEGATIVE Final   Influenza B ([facility]) NEGATIVE NEGATIVE Final  RADIOLOGY:  Dg Chest 2 View  12/06/2015  CLINICAL DATA:  Shortness of breath.  COPD. EXAM: CHEST  2 VIEW COMPARISON:  02/15/2011 FINDINGS: The heart size and pulmonary vascularity are normal. No infiltrates or effusions. Chronic accentuation of the interstitial markings. The lungs are somewhat hyperinflated with flattening of the diaphragm consistent with COPD. Slight calcification in the thoracic aorta. Bones are normal. IMPRESSION: Chronic lung disease.  No acute abnormalities. Electronically Signed   By: Francene BoyersJames  Maxwell M.D.   On: 12/06/2015 11:56   Ct Angio Chest Pe W/cm &/or Wo Cm  12/06/2015  CLINICAL DATA:  Progressive shortness of breath for 1 month, history of COPD, smoker EXAM: CT ANGIOGRAPHY CHEST WITH CONTRAST TECHNIQUE: Multidetector CT imaging of the chest was performed using the standard protocol during bolus administration of intravenous contrast. Multiplanar CT image reconstructions and MIPs were obtained to evaluate the vascular anatomy. CONTRAST:  60mL OMNIPAQUE IOHEXOL 350 MG/ML SOLN COMPARISON:  None. FINDINGS: Mediastinum/Lymph Nodes: Heart size within normal limits. No  pericardial effusion. There is mild thickening of distal esophageal wall please see axial image 90. Gastroesophageal reflux disease cannot be excluded. The study is of excellent technical quality. There is no evidence of pulmonary embolus. No mediastinal hematoma or adenopathy. No aortic aneurysm. Mild atherosclerotic calcifications of thoracic aorta. There is no hilar adenopathy. Lungs/Pleura: Hyperinflation is noted. Mild emphysematous changes with centrilobular and paraseptal emphysematous bullae bilateral upper lobe. No segmental infiltrate or pulmonary edema. No focal consolidation. Mild perihilar bronchitic changes are noted bilaterally. There is atelectasis or scarring left base anterolaterally. Small scarring noted in lingula. Mild emphysematous changes bilateral upper lobes scattered mild emphysematous changes bilateral lower lobes. There is no pleural thickening. No pleural effusion. Upper abdomen: Visualized liver shows no biliary ductal dilatation. Visualized tail of the pancreas is unremarkable. No adrenal gland mass is noted. Musculoskeletal: Sagittal images of the spine shows mild degenerative changes mid and lower thoracic spine. Sagittal view of the sternum is unremarkable. Review of the MIP images confirms the above findings. IMPRESSION: 1. No pulmonary embolus is noted. 2. No mediastinal hematoma or adenopathy. 3. There is mild thickening of distal esophageal wall. Clinical correlation is necessary to exclude gastroesophageal reflux disease. 4. Hyperinflation is noted. Emphysematous changes are noted bilateral upper lobes. Minimal emphysematous changes are noted bilateral lower lobes. No segmental infiltrate or pulmonary edema. Mild perihilar bronchitic changes. Mild scarring lingula. Mild atelectasis or scarring in left base anterolaterally. Electronically Signed   By: Natasha MeadLiviu  Pop M.D.   On: 12/06/2015 12:57     Management plans discussed with the patient, family and they are in  agreement.  CODE STATUS:     Code Status Orders        Start     Ordered   12/06/15 1334  Full code   Continuous     12/06/15 1333    Code Status History    Date Active Date Inactive Code Status Order ID Comments User Context   This patient has a current code status but no historical code status.      TOTAL TIME TAKING CARE OF THIS PATIENT: 40 minutes.    Emersyn Kotarski M.D on 12/07/2015 at 12:58 PM  Between 7am to 6pm - Pager - [phone] After 6pm go to [email or web address] - password [email or web address]  [facility]  Office  [phone]  CC: Primary care physician; No primary care provider on file.

## 2015-12-07 NOTE — Progress Notes (Signed)
SATURATION QUALIFICATIONS: (This note is used to comply with regulatory documentation for home oxygen)  Patient Saturations on Room Air at Rest = 82%   

## 2015-12-07 NOTE — Progress Notes (Signed)
Pt discharge instructions reviewed with verbal understanding. Home with O2, provided by [facility]. Escorted by NT. VS stable upon discharge.

## 2015-12-07 NOTE — Care Management (Addendum)
Home oxygen will be arranged through [facility]. Spoke with representative, Will Anderson. States he is in the process of getting information from Ms. Creely to see if she qualifies to be a  charity case. Gwenette GreetBrenda S Gabi Mcfate RN MSN CCM Care Management [phone]

## 2015-12-11 LAB — CULTURE, BLOOD (ROUTINE X 2)
Culture: NO GROWTH
Culture: NO GROWTH

## 2017-01-19 ENCOUNTER — Emergency Department: Payer: Self-pay

## 2017-01-19 ENCOUNTER — Encounter: Payer: Self-pay | Admitting: Medical Oncology

## 2017-01-19 ENCOUNTER — Inpatient Hospital Stay
Admission: EM | Admit: 2017-01-19 | Discharge: 2017-01-22 | DRG: 871 | Disposition: A | Discharge status: Skilled Nursing Facility | Payer: Self-pay | Attending: Internal Medicine | Admitting: Internal Medicine

## 2017-01-19 DIAGNOSIS — E872 Acidosis, unspecified: Secondary | ICD-10-CM | POA: Insufficient documentation

## 2017-01-19 DIAGNOSIS — Z9049 Acquired absence of other specified parts of digestive tract: Secondary | ICD-10-CM

## 2017-01-19 DIAGNOSIS — M25561 Pain in right knee: Secondary | ICD-10-CM | POA: Diagnosis present

## 2017-01-19 DIAGNOSIS — F419 Anxiety disorder, unspecified: Secondary | ICD-10-CM | POA: Diagnosis present

## 2017-01-19 DIAGNOSIS — J189 Pneumonia, unspecified organism: Secondary | ICD-10-CM | POA: Diagnosis present

## 2017-01-19 DIAGNOSIS — K529 Noninfective gastroenteritis and colitis, unspecified: Secondary | ICD-10-CM | POA: Diagnosis present

## 2017-01-19 DIAGNOSIS — Z7952 Long term (current) use of systemic steroids: Secondary | ICD-10-CM

## 2017-01-19 DIAGNOSIS — J449 Chronic obstructive pulmonary disease, unspecified: Secondary | ICD-10-CM | POA: Diagnosis present

## 2017-01-19 DIAGNOSIS — F17200 Nicotine dependence, unspecified, uncomplicated: Secondary | ICD-10-CM | POA: Diagnosis present

## 2017-01-19 DIAGNOSIS — R52 Pain, unspecified: Secondary | ICD-10-CM

## 2017-01-19 DIAGNOSIS — Z79899 Other long term (current) drug therapy: Secondary | ICD-10-CM

## 2017-01-19 DIAGNOSIS — J440 Chronic obstructive pulmonary disease with acute lower respiratory infection: Secondary | ICD-10-CM | POA: Diagnosis present

## 2017-01-19 DIAGNOSIS — F1010 Alcohol abuse, uncomplicated: Secondary | ICD-10-CM | POA: Diagnosis present

## 2017-01-19 DIAGNOSIS — Z9981 Dependence on supplemental oxygen: Secondary | ICD-10-CM

## 2017-01-19 DIAGNOSIS — J9601 Acute respiratory failure with hypoxia: Secondary | ICD-10-CM | POA: Diagnosis present

## 2017-01-19 DIAGNOSIS — Z7951 Long term (current) use of inhaled steroids: Secondary | ICD-10-CM

## 2017-01-19 DIAGNOSIS — A419 Sepsis, unspecified organism: Principal | ICD-10-CM | POA: Diagnosis present

## 2017-01-19 HISTORY — DX: Alcohol abuse, uncomplicated: F10.10

## 2017-01-19 LAB — URINE DRUG SCREEN, QUALITATIVE (ARMC ONLY)
AMPHETAMINES, UR SCREEN: NOT DETECTED
Barbiturates, Ur Screen: NOT DETECTED
Benzodiazepine, Ur Scrn: NOT DETECTED
COCAINE METABOLITE, UR ~~LOC~~: NOT DETECTED
Cannabinoid 50 Ng, Ur ~~LOC~~: NOT DETECTED
MDMA (Ecstasy)Ur Screen: NOT DETECTED
METHADONE SCREEN, URINE: NOT DETECTED
OPIATE, UR SCREEN: NOT DETECTED
PHENCYCLIDINE (PCP) UR S: NOT DETECTED
Tricyclic, Ur Screen: NOT DETECTED

## 2017-01-19 LAB — CBC WITH DIFFERENTIAL/PLATELET
BASOS PCT: 0 %
Basophils Absolute: 0.1 10*3/uL (ref 0–0.1)
EOS ABS: 0 10*3/uL (ref 0–0.7)
EOS PCT: 0 %
HCT: 54.3 % — ABNORMAL HIGH (ref 35.0–47.0)
Hemoglobin: 18.3 g/dL — ABNORMAL HIGH (ref 12.0–16.0)
LYMPHS ABS: 0.8 10*3/uL — AB (ref 1.0–3.6)
Lymphocytes Relative: 4 %
MCH: 35.4 pg — AB (ref 26.0–34.0)
MCHC: 33.7 g/dL (ref 32.0–36.0)
MCV: 105 fL — ABNORMAL HIGH (ref 80.0–100.0)
MONO ABS: 1.7 10*3/uL — AB (ref 0.2–0.9)
MONOS PCT: 8 %
NEUTROS PCT: 88 %
Neutro Abs: 18.5 10*3/uL — ABNORMAL HIGH (ref 1.4–6.5)
Platelets: 264 10*3/uL (ref 150–440)
RBC: 5.17 MIL/uL (ref 3.80–5.20)
RDW: 17.5 % — AB (ref 11.5–14.5)
WBC: 21.1 10*3/uL — ABNORMAL HIGH (ref 3.6–11.0)

## 2017-01-19 LAB — BASIC METABOLIC PANEL
Anion gap: 14 (ref 5–15)
CALCIUM: 8.6 mg/dL — AB (ref 8.9–10.3)
CO2: 25 mmol/L (ref 22–32)
CREATININE: 0.55 mg/dL (ref 0.44–1.00)
Chloride: 96 mmol/L — ABNORMAL LOW (ref 101–111)
GFR calc Af Amer: >60 mL/min (ref >60)
Glucose, Bld: 144 mg/dL — ABNORMAL HIGH (ref 65–99)
Potassium: 4.3 mmol/L (ref 3.5–5.1)
SODIUM: 135 mmol/L (ref 135–145)

## 2017-01-19 LAB — HEPATIC FUNCTION PANEL
ALK PHOS: 253 U/L — AB (ref 38–126)
ALT: 13 U/L — ABNORMAL LOW (ref 14–54)
AST: 22 U/L (ref 15–41)
Albumin: 3.5 g/dL (ref 3.5–5.0)
BILIRUBIN INDIRECT: 0.6 mg/dL (ref 0.3–0.9)
Bilirubin, Direct: 0.1 mg/dL (ref 0.1–0.5)
TOTAL PROTEIN: 6.9 g/dL (ref 6.5–8.1)
Total Bilirubin: 0.7 mg/dL (ref 0.3–1.2)

## 2017-01-19 LAB — URINALYSIS, COMPLETE (UACMP) WITH MICROSCOPIC
BILIRUBIN URINE: NEGATIVE
Bacteria, UA: NONE SEEN
GLUCOSE, UA: NEGATIVE mg/dL
Hgb urine dipstick: NEGATIVE
Ketones, ur: NEGATIVE mg/dL
LEUKOCYTES UA: NEGATIVE
Nitrite: NEGATIVE
Protein, ur: NEGATIVE mg/dL
RBC / HPF: NONE SEEN RBC/hpf (ref 0–5)
SPECIFIC GRAVITY, URINE: 1.003 — AB (ref 1.005–1.030)
Squamous Epithelial / LPF: NONE SEEN
pH: 6 (ref 5.0–8.0)

## 2017-01-19 LAB — BLOOD GAS, VENOUS
ACID-BASE EXCESS: 1.7 mmol/L (ref 0.0–2.0)
Bicarbonate: 26.6 mmol/L (ref 20.0–28.0)
O2 Saturation: 78 %
PH VEN: 7.41 (ref 7.250–7.430)
Patient temperature: 37
pCO2, Ven: 42 mmHg — ABNORMAL LOW (ref 44.0–60.0)
pO2, Ven: 42 mmHg (ref 32.0–45.0)

## 2017-01-19 LAB — LACTIC ACID, PLASMA: LACTIC ACID, VENOUS: 2.9 mmol/L — AB (ref 0.5–1.9)

## 2017-01-19 LAB — INFLUENZA PANEL BY PCR (TYPE A & B)
INFLAPCR: NEGATIVE
Influenza B By PCR: NEGATIVE

## 2017-01-19 LAB — TROPONIN I: Troponin I: <0.03 ng/mL (ref <0.03)

## 2017-01-19 LAB — ETHANOL: Alcohol, Ethyl (B): 175 mg/dL — ABNORMAL HIGH (ref <5)

## 2017-01-19 MED ORDER — VANCOMYCIN HCL IN DEXTROSE 1-5 GM/200ML-% IV SOLN
1000.0000 mg | Freq: Once | INTRAVENOUS | Status: AC
  Administered 2017-01-19: 1000 mg via INTRAVENOUS
  Filled 2017-01-19 (×2): qty 200

## 2017-01-19 MED ORDER — LORAZEPAM 2 MG/ML IJ SOLN
0.5000 mg | Freq: Once | INTRAMUSCULAR | Status: AC
  Administered 2017-01-19: 0.5 mg via INTRAVENOUS

## 2017-01-19 MED ORDER — ONDANSETRON HCL 4 MG/2ML IJ SOLN: 4.0000 mg | Freq: Four times a day (QID) | INTRAMUSCULAR | Status: DC | PRN

## 2017-01-19 MED ORDER — VITAMIN B-1 100 MG PO TABS
500.0000 mg | ORAL_TABLET | Freq: Once | ORAL | Status: AC
  Administered 2017-01-19: 500 mg via ORAL
  Filled 2017-01-19: qty 5

## 2017-01-19 MED ORDER — ADULT MULTIVITAMIN W/MINERALS CH
1.0000 | ORAL_TABLET | Freq: Every day | ORAL | Status: DC
  Administered 2017-01-20 – 2017-01-22 (×3): 1 via ORAL
  Filled 2017-01-19 (×3): qty 1

## 2017-01-19 MED ORDER — IOPAMIDOL (ISOVUE-370) INJECTION 76%
75.0000 mL | Freq: Once | INTRAVENOUS | Status: AC | PRN
  Administered 2017-01-19: 75 mL via INTRAVENOUS

## 2017-01-19 MED ORDER — FOLIC ACID 1 MG PO TABS
1.0000 mg | ORAL_TABLET | Freq: Every day | ORAL | Status: DC
  Administered 2017-01-20 – 2017-01-22 (×3): 1 mg via ORAL
  Filled 2017-01-19 (×3): qty 1

## 2017-01-19 MED ORDER — LORAZEPAM 2 MG/ML IJ SOLN
INTRAMUSCULAR | Status: AC
  Filled 2017-01-19: qty 1

## 2017-01-19 MED ORDER — IPRATROPIUM-ALBUTEROL 0.5-2.5 (3) MG/3ML IN SOLN
3.0000 mL | Freq: Once | RESPIRATORY_TRACT | Status: AC
  Administered 2017-01-19: 3 mL via RESPIRATORY_TRACT
  Filled 2017-01-19: qty 3

## 2017-01-19 MED ORDER — LORAZEPAM 2 MG/ML IJ SOLN: 0.0000 mg | Freq: Four times a day (QID) | INTRAMUSCULAR | Status: AC

## 2017-01-19 MED ORDER — LORAZEPAM 1 MG PO TABS
1.0000 mg | ORAL_TABLET | Freq: Once | ORAL | Status: AC
  Administered 2017-01-19: 1 mg via ORAL
  Filled 2017-01-19: qty 1

## 2017-01-19 MED ORDER — SODIUM CHLORIDE 0.9 % IV BOLUS (SEPSIS)
1000.0000 mL | Freq: Once | INTRAVENOUS | Status: AC
  Administered 2017-01-19: 1000 mL via INTRAVENOUS

## 2017-01-19 MED ORDER — LORAZEPAM 1 MG PO TABS
1.0000 mg | ORAL_TABLET | Freq: Four times a day (QID) | ORAL | Status: DC | PRN
  Administered 2017-01-20 – 2017-01-22 (×8): 1 mg via ORAL
  Filled 2017-01-19 (×8): qty 1

## 2017-01-19 MED ORDER — BUDESONIDE 0.25 MG/2ML IN SUSP
0.2500 mg | Freq: Two times a day (BID) | RESPIRATORY_TRACT | Status: DC
  Administered 2017-01-20 – 2017-01-22 (×6): 0.25 mg via RESPIRATORY_TRACT
  Filled 2017-01-19 (×6): qty 2

## 2017-01-19 MED ORDER — ALBUTEROL SULFATE (2.5 MG/3ML) 0.083% IN NEBU
5.0000 mg | INHALATION_SOLUTION | Freq: Once | RESPIRATORY_TRACT | Status: AC
  Administered 2017-01-19: 5 mg via RESPIRATORY_TRACT
  Filled 2017-01-19: qty 6

## 2017-01-19 MED ORDER — OXYCODONE HCL 5 MG PO TABS
5.0000 mg | ORAL_TABLET | ORAL | Status: DC | PRN
  Administered 2017-01-20 – 2017-01-22 (×12): 5 mg via ORAL
  Filled 2017-01-19 (×12): qty 1

## 2017-01-19 MED ORDER — DEXTROSE 5 % IV SOLN
2.0000 g | Freq: Once | INTRAVENOUS | Status: AC
  Administered 2017-01-19: 2 g via INTRAVENOUS
  Filled 2017-01-19: qty 2

## 2017-01-19 MED ORDER — ONDANSETRON HCL 4 MG/2ML IJ SOLN
4.0000 mg | Freq: Once | INTRAMUSCULAR | Status: AC
  Administered 2017-01-19: 4 mg via INTRAVENOUS
  Filled 2017-01-19: qty 2

## 2017-01-19 MED ORDER — VITAMIN B-1 100 MG PO TABS
100.0000 mg | ORAL_TABLET | Freq: Every day | ORAL | Status: DC
  Administered 2017-01-20 – 2017-01-22 (×3): 100 mg via ORAL
  Filled 2017-01-19 (×3): qty 1

## 2017-01-19 MED ORDER — ENOXAPARIN SODIUM 40 MG/0.4ML ~~LOC~~ SOLN
40.0000 mg | SUBCUTANEOUS | Status: DC
  Administered 2017-01-20 – 2017-01-21 (×2): 40 mg via SUBCUTANEOUS
  Filled 2017-01-19 (×2): qty 0.4

## 2017-01-19 MED ORDER — BENZONATATE 100 MG PO CAPS
200.0000 mg | ORAL_CAPSULE | Freq: Three times a day (TID) | ORAL | Status: DC | PRN
  Administered 2017-01-20 – 2017-01-22 (×2): 200 mg via ORAL
  Filled 2017-01-19 (×2): qty 2

## 2017-01-19 MED ORDER — LORAZEPAM 2 MG/ML IJ SOLN
0.0000 mg | Freq: Two times a day (BID) | INTRAMUSCULAR | Status: DC
  Administered 2017-01-22: 2 mg via INTRAVENOUS

## 2017-01-19 MED ORDER — ONDANSETRON HCL 4 MG PO TABS: 4.0000 mg | ORAL_TABLET | Freq: Four times a day (QID) | ORAL | Status: DC | PRN

## 2017-01-19 MED ORDER — GUAIFENESIN-DM 100-10 MG/5ML PO SYRP
5.0000 mL | ORAL_SOLUTION | ORAL | Status: DC | PRN
  Administered 2017-01-21 – 2017-01-22 (×2): 5 mL via ORAL
  Filled 2017-01-19 (×2): qty 5

## 2017-01-19 MED ORDER — SODIUM CHLORIDE 0.9 % IV BOLUS (SEPSIS)
500.0000 mL | Freq: Once | INTRAVENOUS | Status: AC
  Administered 2017-01-19: 500 mL via INTRAVENOUS

## 2017-01-19 MED ORDER — SODIUM CHLORIDE 0.9 % IV SOLN
INTRAVENOUS | Status: AC
  Administered 2017-01-19: via INTRAVENOUS

## 2017-01-19 MED ORDER — ACETAMINOPHEN 325 MG PO TABS: 650.0000 mg | ORAL_TABLET | Freq: Four times a day (QID) | ORAL | Status: DC | PRN

## 2017-01-19 MED ORDER — VANCOMYCIN HCL IN DEXTROSE 750-5 MG/150ML-% IV SOLN
750.0000 mg | INTRAVENOUS | Status: DC
Start: 2017-01-20 — End: 2017-01-20
  Filled 2017-01-19: qty 150

## 2017-01-19 MED ORDER — DEXTROSE 5 % IV SOLN
2.0000 g | Freq: Two times a day (BID) | INTRAVENOUS | Status: DC
  Administered 2017-01-20 – 2017-01-22 (×4): 2 g via INTRAVENOUS
  Filled 2017-01-19 (×6): qty 2

## 2017-01-19 MED ORDER — THIAMINE HCL 100 MG/ML IJ SOLN
100.0000 mg | Freq: Every day | INTRAMUSCULAR | Status: DC
  Filled 2017-01-19: qty 2

## 2017-01-19 MED ORDER — IPRATROPIUM BROMIDE 0.02 % IN SOLN
0.5000 mg | Freq: Four times a day (QID) | RESPIRATORY_TRACT | Status: DC
  Administered 2017-01-20 – 2017-01-22 (×9): 0.5 mg via RESPIRATORY_TRACT
  Filled 2017-01-19 (×9): qty 2.5

## 2017-01-19 MED ORDER — MORPHINE SULFATE (PF) 2 MG/ML IV SOLN
2.0000 mg | Freq: Once | INTRAVENOUS | Status: AC
  Administered 2017-01-19: 2 mg via INTRAVENOUS

## 2017-01-19 MED ORDER — DEXTROSE 5 % IV SOLN: 2.0000 g | Freq: Once | INTRAVENOUS | Status: DC

## 2017-01-19 MED ORDER — LEVALBUTEROL HCL 1.25 MG/0.5ML IN NEBU
1.2500 mg | INHALATION_SOLUTION | Freq: Four times a day (QID) | RESPIRATORY_TRACT | Status: DC
  Administered 2017-01-20: 1.25 mg via RESPIRATORY_TRACT
  Filled 2017-01-19: qty 0.5

## 2017-01-19 MED ORDER — LORAZEPAM 2 MG/ML IJ SOLN
1.0000 mg | Freq: Four times a day (QID) | INTRAMUSCULAR | Status: DC | PRN
  Filled 2017-01-19: qty 1

## 2017-01-19 MED ORDER — MORPHINE SULFATE (PF) 2 MG/ML IV SOLN
INTRAVENOUS | Status: AC
  Filled 2017-01-19: qty 1

## 2017-01-19 MED ORDER — ACETAMINOPHEN 650 MG RE SUPP: 650.0000 mg | Freq: Four times a day (QID) | RECTAL | Status: DC | PRN

## 2017-01-19 NOTE — ED Notes (Signed)
Patient transported to X-ray 

## 2017-01-19 NOTE — ED Notes (Signed)
Hospitalist talked with receiving RN and said he was putting order parameters for her to follow regarding patient's cardiac condition.  He also felt the multiple nebulizer treatments she received in the ER were responsible for her tachycardia and would give orders for future breatinh tx that would not affect her rate so much.

## 2017-01-19 NOTE — ED Notes (Signed)
Patient transported to CT 

## 2017-01-19 NOTE — ED Notes (Signed)
RT called to come get VBG

## 2017-01-19 NOTE — ED Notes (Signed)
Hospitalist at bedside 

## 2017-01-19 NOTE — H&P (Addendum)
[place] [facility] [place] NAME: Rose Torres    MR#:  [id number]  DATE OF BIRTH:  [birth year]  DATE OF ADMISSION:  01/19/2017  PRIMARY CARE PHYSICIAN: Patient, No Pcp Per   REQUESTING/REFERRING PHYSICIAN: Quentin Cornwall, MD  CHIEF COMPLAINT:   Chief Complaint  Patient presents with  . Emesis  . Weakness  . Chest Pain  . Shortness of Breath    HISTORY OF PRESENT ILLNESS:  Rose Torres  is a 66 y.o. female who presents with Pleurodynia, shortness of breath, myalgias.  Here she was found to have pneumonia. She met sepsis criteria. Hospitalists were called for admission  PAST MEDICAL HISTORY:   Past Medical History:  Diagnosis Date  . Alcohol abuse   . Bronchitis   . COPD (chronic obstructive pulmonary disease) ([place])   . Shortness of breath dyspnea     PAST SURGICAL HISTORY:   Past Surgical History:  Procedure Laterality Date  . ABDOMINAL HYSTERECTOMY    . CYST REMOVAL HAND      SOCIAL HISTORY:   Social History  Substance Use Topics  . Smoking status: Current Every Day Smoker    Packs/day: 0.50  . Smokeless tobacco: Not on file  . Alcohol use No    FAMILY HISTORY:   Family History  Problem Relation Age of Onset  . Healthy Mother   . Melanoma Father     DRUG ALLERGIES:  No Known Allergies  MEDICATIONS AT HOME:   Prior to Admission medications   Medication Sig Start Date End Date Taking? Authorizing Provider  budesonide (PULMICORT) 0.25 MG/2ML nebulizer solution Take 2 mLs (0.25 mg total) by nebulization 2 (two) times daily. 12/07/15   Fritzi Mandes, MD  ibuprofen (ADVIL,MOTRIN) 200 MG tablet Take 200 mg by mouth every 6 (six) hours as needed.    [provider]  Ipratropium-Albuterol (COMBIVENT) 20-100 MCG/ACT AERS respimat Inhale 1 puff into the lungs every 6 (six) hours as needed for wheezing. 12/07/15   Fritzi Mandes, MD  nicotine (NICODERM CQ - DOSED IN MG/24 HOURS) 21 mg/24hr patch Place 1 patch (21 mg  total) onto the skin daily. 12/07/15   Fritzi Mandes, MD  predniSONE (DELTASONE) 50 MG tablet Take 50 mg daily taper by 10 mg thens stop 12/08/15   Fritzi Mandes, MD    REVIEW OF SYSTEMS:  Review of Systems  Constitutional: Positive for chills and malaise/fatigue. Negative for fever and weight loss.  HENT: Negative for ear pain, hearing loss and tinnitus.   Eyes: Negative for blurred vision, double vision, pain and redness.  Respiratory: Positive for cough and shortness of breath. Negative for hemoptysis.   Cardiovascular: Negative for chest pain, palpitations, orthopnea and leg swelling.  Gastrointestinal: Negative for abdominal pain, constipation, diarrhea, nausea and vomiting.  Genitourinary: Negative for dysuria, frequency and hematuria.  Musculoskeletal: Negative for back pain, joint pain and neck pain.  Skin:       No acne, rash, or lesions  Neurological: Negative for dizziness, tremors, focal weakness and weakness.  Endo/Heme/Allergies: Negative for polydipsia. Does not bruise/bleed easily.  Psychiatric/Behavioral: Negative for depression. The patient is not nervous/anxious and does not have insomnia.      VITAL SIGNS:   Vitals:   01/19/17 1843 01/19/17 1844 01/19/17 1900  BP: (!) 173/155  120/72  Pulse: (!) 118  (!) 116  Resp: 20  (!) 22  Temp: 99.2 F (37.3 C)    TempSrc: Oral    SpO2: 96%  96%  Weight:  49.9 kg (110 lb)   Height:  _0  (1.575 m)    Wt Readings from Last 3 Encounters:  01/19/17 49.9 kg (110 lb)  12/06/15 46.3 kg (102 lb)    PHYSICAL EXAMINATION:  Physical Exam  Vitals reviewed. Constitutional: She is oriented to person, place, and time. She appears well-developed and well-nourished. No distress.  HENT:  Head: Normocephalic and atraumatic.  Mouth/Throat: Oropharynx is clear and moist.  Eyes: Conjunctivae and EOM are normal. Pupils are equal, round, and reactive to light. No scleral icterus.  Neck: Normal range of motion. Neck supple. No JVD present.  No thyromegaly present.  Cardiovascular: Regular rhythm and intact distal pulses.  Exam reveals no gallop and no friction rub.   No murmur heard. tachycardic  Respiratory: She is in respiratory distress. She has no wheezes. She has no rales.  Rhonchi  GI: Soft. Bowel sounds are normal. She exhibits no distension. There is no tenderness.  Musculoskeletal: Normal range of motion. She exhibits no edema.  No arthritis, no gout  Lymphadenopathy:    She has no cervical adenopathy.  Neurological: She is alert and oriented to person, place, and time. No cranial nerve deficit.  No dysarthria, no aphasia  Skin: Skin is warm and dry. No rash noted. No erythema.  Psychiatric: She has a normal mood and affect. Her behavior is normal. Judgment and thought content normal.    LABORATORY PANEL:   CBC  Recent Labs Lab 01/19/17 1852  WBC 21.1*  HGB 18.3*  HCT 54.3*  PLT 264   ------------------------------------------------------------------------------------------------------------------  Chemistries   Recent Labs Lab 01/19/17 1853  NA 135  K 4.3  CL 96*  CO2 25  GLUCOSE 144*  BUN <5*  CREATININE 0.55  CALCIUM 8.6*  AST 22  ALT 13*  ALKPHOS 253*  BILITOT 0.7   ------------------------------------------------------------------------------------------------------------------  Cardiac Enzymes  Recent Labs Lab 01/19/17 1852  TROPONINI <0.03   ------------------------------------------------------------------------------------------------------------------  RADIOLOGY:  Dg Chest 2 View  Result Date: 01/19/2017 CLINICAL DATA:  Chest pain.  Pneumonia EXAM: CHEST  2 VIEW COMPARISON:  12/06/2015 FINDINGS: The heart size and mediastinal contours are within normal limits. Advanced changes of emphysema. The visualized skeletal structures are unremarkable. IMPRESSION: No active cardiopulmonary disease. Emphysema (ICD10-J43.9). Electronically Signed   By: Kerby Moors M.D.   On:  01/19/2017 19:37    EKG:   Orders placed or performed during the hospital encounter of 01/19/17  . EKG 12-Lead  . EKG 12-Lead    IMPRESSION AND PLAN:  Principal Problem:   Sepsis ([place]) - IV antibiotics, lactic acid was elevated will administer IV fluids and trend until within normal limits, blood pressure stable, this is due to her pneumonia, culture sent from the ED Active Problems:   CAP (community acquired pneumonia) - IV antibiotics and cultures as above, additional when necessary supportive medications ordered   COPD (chronic obstructive pulmonary disease) ([place]) - home dose meds as well as when necessary nebulizers   Alcohol abuse - CIWA protocol  All the records are reviewed and case discussed with ED provider. Management plans discussed with the patient and/or family.  DVT PROPHYLAXIS: SubQ lovenox  GI PROPHYLAXIS: None  ADMISSION STATUS: Inpatient  CODE STATUS: Full Code Status History    Date Active Date Inactive Code Status Order ID Comments User Context   12/06/2015  1:33 PM 12/07/2015  7:03 PM Full Code [id number]  Loletha Grayer, MD ED      TOTAL TIME TAKING CARE OF THIS PATIENT: 76  minutes.   Layni Kreamer, [place] 01/19/2017, 9:30 PM  [facility]  Office  [phone]  CC: Primary care physician; Patient, No Pcp Per  Note:  This document was prepared using Dragon voice recognition software and may include unintentional dictation errors.

## 2017-01-19 NOTE — ED Provider Notes (Signed)
[facility] Emergency Department Provider Note    First MD Initiated Contact with Patient 01/19/17 1834     (approximate)  I have reviewed the triage vital signs and the nursing notes.   HISTORY  Chief Complaint Emesis; Weakness; Chest Pain; and Shortness of Breath    HPI Rose Torres is a 66 y.o. female history of COPD and bronchitis who wears home O2 intermittently presents with multiple complaints including one week of worsening nausea and vomiting and midsternal chest pain with shortness of breath. States that the chest pain is what brought her to the ER today. States that she's not been tolerating any appetite due to nausea. But has been having significant alcohol use over the past several days. Patient clinically intoxicated upon arrival to the ER. Also reporting fevers and chills at home.   Past Medical History:  Diagnosis Date  . Alcohol abuse   . Bronchitis   . COPD (chronic obstructive pulmonary disease) (HCC)   . Shortness of breath dyspnea    Family History  Problem Relation Age of Onset  . Healthy Mother   . Melanoma Father    Past Surgical History:  Procedure Laterality Date  . ABDOMINAL HYSTERECTOMY    . CYST REMOVAL HAND     Patient Active Problem List   Diagnosis Date Noted  . SIRS (systemic inflammatory response syndrome) (HCC) 01/19/2017  . Gastroenteritis 01/19/2017  . COPD (chronic obstructive pulmonary disease) (HCC) 01/19/2017  . Lactic acidosis 01/19/2017  . Acute respiratory failure with hypoxia (HCC) 12/06/2015      Prior to Admission medications   Medication Sig Start Date End Date Taking? Authorizing Provider  budesonide (PULMICORT) 0.25 MG/2ML nebulizer solution Take 2 mLs (0.25 mg total) by nebulization 2 (two) times daily. 12/07/15   Enedina FinnerSona Patel, MD  ibuprofen (ADVIL,MOTRIN) 200 MG tablet Take 200 mg by mouth every 6 (six) hours as needed.    Historical Provider, MD  Ipratropium-Albuterol (COMBIVENT)  20-100 MCG/ACT AERS respimat Inhale 1 puff into the lungs every 6 (six) hours as needed for wheezing. 12/07/15   Enedina FinnerSona Patel, MD  nicotine (NICODERM CQ - DOSED IN MG/24 HOURS) 21 mg/24hr patch Place 1 patch (21 mg total) onto the skin daily. 12/07/15   Enedina FinnerSona Patel, MD  predniSONE (DELTASONE) 50 MG tablet Take 50 mg daily taper by 10 mg thens stop 12/08/15   Enedina FinnerSona Patel, MD    Allergies Patient has no known allergies.    Social History Social History  Substance Use Topics  . Smoking status: Current Every Day Smoker    Packs/day: 0.50  . Smokeless tobacco: Current User  . Alcohol use No    Review of Systems Patient denies headaches, rhinorrhea, blurry vision, numbness, shortness of breath, chest pain, edema, cough, abdominal pain, nausea, vomiting, diarrhea, dysuria, fevers, rashes or hallucinations unless otherwise stated above in HPI. ____________________________________________   PHYSICAL EXAM:  VITAL SIGNS: Vitals:   01/19/17 1843 01/19/17 1900  BP: (!) 173/155 120/72  Pulse: (!) 118 (!) 116  Resp: 20 (!) 22  Temp: 99.2 F (37.3 C)     Constitutional: Alert intoxicated appearing and in no acute distress. Eyes: Conjunctivae are injected. PERRL. EOMI. Head: Atraumatic. Nose: No congestion/rhinnorhea. Mouth/Throat: Mucous membranes are moist.  Oropharynx non-erythematous. Neck: No stridor. Painless ROM. No cervical spine tenderness to palpation Hematological/Lymphatic/Immunilogical: No cervical lymphadenopathy. Cardiovascular:mildly tachycardic regular rhythm. Grossly normal heart sounds.  Good peripheral circulation. Respiratory: tachypnea, speaking in short phrases, prolonged expiratory phase, diminished breathsounds in  bilateral bases. Gastrointestinal: Soft and nontender. No distention. No abdominal bruits. No CVA tenderness. Genitourinary:  Musculoskeletal: No lower extremity tenderness nor edema.  No joint effusions. Neurologic:  Normal speech and language. No gross  focal neurologic deficits are appreciated. No gait instability. Skin:  Skin is warm, dry and intact. No rash noted. Psychiatric: Mood and affect are normal. Speech and behavior are normal.  ____________________________________________   LABS (all labs ordered are listed, but only abnormal results are displayed)  Results for orders placed or performed during the hospital encounter of 01/19/17 (from the past 24 hour(s))  Troponin I     Status: None   Collection Time: 01/19/17  6:52 PM  Result Value Ref Range   Troponin I <0.03 <0.03 ng/mL  CBC WITH DIFFERENTIAL     Status: Abnormal   Collection Time: 01/19/17  6:52 PM  Result Value Ref Range   WBC 21.1 (H) 3.6 - 11.0 K/uL   RBC 5.17 3.80 - 5.20 MIL/uL   Hemoglobin 18.3 (H) 12.0 - 16.0 g/dL   HCT 16.1 (H) 09.6 - 04.5 %   MCV 105.0 (H) 80.0 - 100.0 fL   MCH 35.4 (H) 26.0 - 34.0 pg   MCHC 33.7 32.0 - 36.0 g/dL   RDW 40.9 (H) 81.1 - 91.4 %   Platelets 264 150 - 440 K/uL   Neutrophils Relative % 88 %   Neutro Abs 18.5 (H) 1.4 - 6.5 K/uL   Lymphocytes Relative 4 %   Lymphs Abs 0.8 (L) 1.0 - 3.6 K/uL   Monocytes Relative 8 %   Monocytes Absolute 1.7 (H) 0.2 - 0.9 K/uL   Eosinophils Relative 0 %   Eosinophils Absolute 0.0 0 - 0.7 K/uL   Basophils Relative 0 %   Basophils Absolute 0.1 0 - 0.1 K/uL  Ethanol     Status: Abnormal   Collection Time: 01/19/17  6:52 PM  Result Value Ref Range   Alcohol, Ethyl (B) 175 (H) <5 mg/dL  Basic metabolic panel     Status: Abnormal   Collection Time: 01/19/17  6:53 PM  Result Value Ref Range   Sodium 135 135 - 145 mmol/L   Potassium 4.3 3.5 - 5.1 mmol/L   Chloride 96 (L) 101 - 111 mmol/L   CO2 25 22 - 32 mmol/L   Glucose, Bld 144 (H) 65 - 99 mg/dL   BUN <5 (L) 6 - 20 mg/dL   Creatinine, Ser 7.82 0.44 - 1.00 mg/dL   Calcium 8.6 (L) 8.9 - 10.3 mg/dL   GFR calc non Af Amer >60 >60 mL/min   GFR calc Af Amer >60 >60 mL/min   Anion gap 14 5 - 15  Hepatic function panel     Status: Abnormal    Collection Time: 01/19/17  6:53 PM  Result Value Ref Range   Total Protein 6.9 6.5 - 8.1 g/dL   Albumin 3.5 3.5 - 5.0 g/dL   AST 22 15 - 41 U/L   ALT 13 (L) 14 - 54 U/L   Alkaline Phosphatase 253 (H) 38 - 126 U/L   Total Bilirubin 0.7 0.3 - 1.2 mg/dL   Bilirubin, Direct 0.1 0.1 - 0.5 mg/dL   Indirect Bilirubin 0.6 0.3 - 0.9 mg/dL  Lactic acid, plasma     Status: Abnormal   Collection Time: 01/19/17  7:20 PM  Result Value Ref Range   Lactic Acid, Venous 2.9 (HH) 0.5 - 1.9 mmol/L  Blood gas, venous ([facility], [facility], [facility])     Status:  Abnormal   Collection Time: 01/19/17  7:20 PM  Result Value Ref Range   pH, Ven 7.41 7.250 - 7.430   pCO2, Ven 42 (L) 44.0 - 60.0 mmHg   pO2, Ven 42.0 32.0 - 45.0 mmHg   Bicarbonate 26.6 20.0 - 28.0 mmol/L   Acid-Base Excess 1.7 0.0 - 2.0 mmol/L   O2 Saturation 78.0 %   Patient temperature 37.0    Collection site RIGHT ANTECUBITAL    Sample type VENOUS   Urinalysis, Complete w Microscopic     Status: Abnormal   Collection Time: 01/19/17  7:20 PM  Result Value Ref Range   Color, Urine STRAW (A) YELLOW   APPearance CLEAR (A) CLEAR   Specific Gravity, Urine 1.003 (L) 1.005 - 1.030   pH 6.0 5.0 - 8.0   Glucose, UA NEGATIVE NEGATIVE mg/dL   Hgb urine dipstick NEGATIVE NEGATIVE   Bilirubin Urine NEGATIVE NEGATIVE   Ketones, ur NEGATIVE NEGATIVE mg/dL   Protein, ur NEGATIVE NEGATIVE mg/dL   Nitrite NEGATIVE NEGATIVE   Leukocytes, UA NEGATIVE NEGATIVE   RBC / HPF NONE SEEN 0 - 5 RBC/hpf   WBC, UA 0-5 0 - 5 WBC/hpf   Bacteria, UA NONE SEEN NONE SEEN   Squamous Epithelial / LPF NONE SEEN NONE SEEN   Mucous PRESENT   Urine Drug Screen, Qualitative ([facility] only)     Status: None   Collection Time: 01/19/17  7:20 PM  Result Value Ref Range   Tricyclic, Ur Screen NONE DETECTED NONE DETECTED   Amphetamines, Ur Screen NONE DETECTED NONE DETECTED   MDMA (Ecstasy)Ur Screen NONE DETECTED NONE DETECTED   Cocaine Metabolite,Ur [place] NONE DETECTED NONE DETECTED   Opiate,  Ur Screen NONE DETECTED NONE DETECTED   Phencyclidine (PCP) Ur S NONE DETECTED NONE DETECTED   Cannabinoid 50 Ng, Ur [place] NONE DETECTED NONE DETECTED   Barbiturates, Ur Screen NONE DETECTED NONE DETECTED   Benzodiazepine, Ur Scrn NONE DETECTED NONE DETECTED   Methadone Scn, Ur NONE DETECTED NONE DETECTED   ____________________________________________  EKG My review and personal interpretation at Time: 18:40   Indication: weakness/chest pain  Rate: 135  Rhythm: sinus Axis: normal Other: normal intervals, non specific st changes, no STEMI ____________________________________________  RADIOLOGY  I personally reviewed all radiographic images ordered to evaluate for the above acute complaints and reviewed radiology reports and findings.  These findings were personally discussed with the patient.  Please see medical record for radiology report.  ____________________________________________   PROCEDURES  Procedure(s) performed:  Procedures    Critical Care performed: yes CRITICAL CARE Performed by: Willy Eddy   Total critical care time: 45 minutes  Critical care time was exclusive of separately billable procedures and treating other patients.  Critical care was necessary to treat or prevent imminent or life-threatening deterioration.  Critical care was time spent personally by me on the following activities: development of treatment plan with patient and/or surrogate as well as nursing, discussions with consultants, evaluation of patient's response to treatment, examination of patient, obtaining history from patient or surrogate, ordering and performing treatments and interventions, ordering and review of laboratory studies, ordering and review of radiographic studies, pulse oximetry and re-evaluation of patient's condition.  ____________________________________________   INITIAL IMPRESSION / ASSESSMENT AND PLAN / ED COURSE  Pertinent labs & imaging results that were  available during my care of the patient were reviewed by me and considered in my medical decision making (see chart for details).  DDX: Dehydration, sepsis, pna, uti, hypoglycemia, cva, drug  effect,    Rose Torres is a 66 y.o. who presents to the ED with symptoms as described above. Patient is critically ill-appearing with tachycardia and tachypnea. Patient's presentation also complicated by her use of alcohol. Blood work ordered to evaluate for acute abnormality and to evaluate for the above differential area patient given IV fluids. Continue with supple oxygen. Patient does have evidence of acute leukocytosis with left shift.  Also has lactic acidosis. Chest x-ray shows diffuse emphysema. Her abdominal exam is otherwise benign. Will start broad-spectrum antibiotics due to concern for sepsis.  The patient will be placed on continuous pulse oximetry and telemetry for monitoring.  Laboratory evaluation will be sent to evaluate for the above complaints.     Clinical Course as of Jan 19 2157  Fri Jan 19, 2017  2118 Patient with persistent tachycardia and some shortness of breath. Most likely underlying emphysema and probable Sirs reaction based on markers of underlying sepsis. Patient has received IV antibiotics and cultures obtained. Will add on influenza. Do feel patient will require CT chest to evaluate for any evidence of pulmonary embolism. We'll continue IV fluids  [PR]    Clinical Course User Index [PR] Willy Eddy, MD     ____________________________________________   FINAL CLINICAL IMPRESSION(S) / ED DIAGNOSES  Final diagnoses:  Sepsis (HCC)  Sepsis, due to unspecified organism [facility])  Community acquired pneumonia, unspecified laterality  Alcohol abuse      NEW MEDICATIONS STARTED DURING THIS VISIT:  New Prescriptions   No medications on file     Note:  This document was prepared using Dragon voice recognition software and may include unintentional  dictation errors.    Willy Eddy, MD 01/19/17 2158

## 2017-01-19 NOTE — ED Triage Notes (Signed)
Pt from home via ems with multiple complaints, pt reports 1 week ago she began to have nausea and vomiting, pt has been supplementing with ETOH. Pt also reports today she began having sharp pain to center of chest with sob and body aches. Pt wears home O2 3L.

## 2017-01-19 NOTE — ED Notes (Signed)
Pharmacy notified that the Pyxis is dispensing 750mg /17750mL Vanc in place of 1000mg /13350mL.

## 2017-01-19 NOTE — Progress Notes (Addendum)
Pharmacy Antibiotic Note  Rose Torres is a 66 y.o. female admitted on 01/19/2017 with sepsis.  Pharmacy has been consulted for Vancomycin dosing.  Ke=0.051, t1/2=13.6hr, Vd=35L  Plan: Vancomycin 750 IV every 18 hours.  Goal trough 15-20 mcg/mL. Will check a a trough level prior to 5th dose.   Cefepime 2 grams q 12 hours ordered.  Height: 5\' 2"  (157.5 cm) Weight: 110 lb (49.9 kg) IBW/kg (Calculated) : 50.1  Temp (24hrs), Avg:99.2 F (37.3 C), Min:99.2 F (37.3 C), Max:99.2 F (37.3 C)   Recent Labs Lab 01/19/17 1852 01/19/17 1853 01/19/17 1920  WBC 21.1*  --   --   CREATININE  --  0.55  --   LATICACIDVEN  --   --  2.9*    Estimated Creatinine Clearance: 56.7 mL/min (by C-G formula based on SCr of 0.55 mg/dL).    No Known Allergies  Antimicrobials this admission: Vancomycin 5/4 >>   Cefepime 5/4 >>   Thank you for allowing pharmacy to be a part of this patient's care.  Clovia CuffLisa Kluttz, PharmD, BCPS 01/19/2017 9:59 PM

## 2017-01-20 LAB — CBC
HCT: 42.1 % (ref 35.0–47.0)
HEMOGLOBIN: 14 g/dL (ref 12.0–16.0)
MCH: 34.9 pg — AB (ref 26.0–34.0)
MCHC: 33.3 g/dL (ref 32.0–36.0)
MCV: 105 fL — AB (ref 80.0–100.0)
PLATELETS: 189 10*3/uL (ref 150–440)
RBC: 4.01 MIL/uL (ref 3.80–5.20)
RDW: 17.7 % — ABNORMAL HIGH (ref 11.5–14.5)
WBC: 24.2 10*3/uL — AB (ref 3.6–11.0)

## 2017-01-20 LAB — BASIC METABOLIC PANEL
ANION GAP: 8 (ref 5–15)
BUN: <5 mg/dL — ABNORMAL LOW (ref 6–20)
CHLORIDE: 106 mmol/L (ref 101–111)
CO2: 23 mmol/L (ref 22–32)
CREATININE: 0.53 mg/dL (ref 0.44–1.00)
Calcium: 7 mg/dL — ABNORMAL LOW (ref 8.9–10.3)
GFR calc non Af Amer: >60 mL/min (ref >60)
Glucose, Bld: 124 mg/dL — ABNORMAL HIGH (ref 65–99)
POTASSIUM: 3.6 mmol/L (ref 3.5–5.1)
SODIUM: 137 mmol/L (ref 135–145)

## 2017-01-20 LAB — LACTIC ACID, PLASMA
LACTIC ACID, VENOUS: 3.5 mmol/L — AB (ref 0.5–1.9)
Lactic Acid, Venous: 2.7 mmol/L (ref 0.5–1.9)

## 2017-01-20 LAB — MRSA PCR SCREENING: MRSA by PCR: NEGATIVE

## 2017-01-20 MED ORDER — METHYLPREDNISOLONE SODIUM SUCC 40 MG IJ SOLR: 40.0000 mg | Freq: Three times a day (TID) | INTRAMUSCULAR | Status: DC

## 2017-01-20 MED ORDER — LEVALBUTEROL HCL 1.25 MG/0.5ML IN NEBU
1.2500 mg | INHALATION_SOLUTION | Freq: Four times a day (QID) | RESPIRATORY_TRACT | Status: DC
  Administered 2017-01-20 – 2017-01-22 (×8): 1.25 mg via RESPIRATORY_TRACT
  Filled 2017-01-20 (×8): qty 0.5

## 2017-01-20 MED ORDER — SODIUM CHLORIDE 0.9 % IV BOLUS (SEPSIS)
1000.0000 mL | Freq: Once | INTRAVENOUS | Status: AC
  Administered 2017-01-20: 1000 mL via INTRAVENOUS

## 2017-01-20 MED ORDER — METHYLPREDNISOLONE SODIUM SUCC 40 MG IJ SOLR
40.0000 mg | Freq: Every day | INTRAMUSCULAR | Status: DC
  Administered 2017-01-21: 40 mg via INTRAVENOUS
  Filled 2017-01-20: qty 1

## 2017-01-20 NOTE — NC FL2 (Signed)
Rose Torres MEDICAID FL2 LEVEL OF CARE SCREENING TOOL     IDENTIFICATION  Patient Name: Rose Torres Birthdate: 04-23-1951 Sex: female Admission Date (Current Location): 01/19/2017  [place] and [place] Number:  Chiropodist and Address:  [facility], [address], [place], [place] [address]      Provider Number: [id number]  Attending Physician Name and Address:  Rose Saran, MD  Relative Name and Phone Number:       Current Level of Care: Hospital Recommended Level of Care: Skilled Nursing Facility Prior Approval Number:    Date Approved/Denied:   PASRR Number:    Discharge Plan: SNF    Current Diagnoses: Patient Active Problem List   Diagnosis Date Noted  . Sepsis (HCC) 01/19/2017  . Gastroenteritis 01/19/2017  . COPD (chronic obstructive pulmonary disease) (HCC) 01/19/2017  . Lactic acidosis 01/19/2017  . CAP (community acquired pneumonia) 01/19/2017  . Alcohol abuse 01/19/2017  . Acute respiratory failure with hypoxia (HCC) 12/06/2015    Orientation RESPIRATION BLADDER Height & Weight     Self, Time, Situation, Place  O2 (Chronic, 3L) Continent Weight: 122 lb 12.8 oz (55.7 kg) Height:  5\' 2"  (157.5 cm)  BEHAVIORAL SYMPTOMS/MOOD NEUROLOGICAL BOWEL NUTRITION STATUS      Continent    AMBULATORY STATUS COMMUNICATION OF NEEDS Skin   Extensive Assist Verbally Bruising                       Personal Care Assistance Level of Assistance  Bathing, Feeding, Dressing Bathing Assistance: Limited assistance Feeding assistance: Independent Dressing Assistance: Limited assistance     Functional Limitations Info             SPECIAL CARE FACTORS FREQUENCY  PT (By licensed PT)     PT Frequency: Up to 5X per day, 5 days per week              Contractures      Additional Factors Info                  Current Medications (01/20/2017):  This is the current hospital active medication list Current  Facility-Administered Medications  Medication Dose Route Frequency Provider Last Rate Last Dose  . acetaminophen (TYLENOL) tablet 650 mg  650 mg Oral Q6H PRN Rose Manis, MD       Or  . acetaminophen (TYLENOL) suppository 650 mg  650 mg Rectal Q6H PRN Rose Manis, MD      . benzonatate (TESSALON) capsule 200 mg  200 mg Oral TID PRN Rose Manis, MD   200 mg at 01/20/17 1420  . budesonide (PULMICORT) nebulizer solution 0.25 mg  0.25 mg Nebulization BID Rose Manis, MD   0.25 mg at 01/20/17 0826  . ceFEPIme (MAXIPIME) 2 g in dextrose 5 % 50 mL IVPB  2 g Intravenous Q12H Rose Manis, MD   Stopped at 01/20/17 1116  . enoxaparin (LOVENOX) injection 40 mg  40 mg Subcutaneous Q24H Rose Manis, MD      . folic acid Rose Torres) tablet 1 mg  1 mg Oral Daily Rose Manis, MD   1 mg at 01/20/17 [phone]  . guaiFENesin-dextromethorphan (ROBITUSSIN DM) 100-10 MG/5ML syrup 5 mL  5 mL Oral Q4H PRN Rose Manis, MD      . ipratropium (ATROVENT) nebulizer solution 0.5 mg  0.5 mg Nebulization Q6H Rose Manis, MD   0.5 mg at 01/20/17 1452  . levalbuterol (XOPENEX) nebulizer solution 1.25 mg  1.25 mg  Nebulization Q6H Rose ManisWillis, David, MD   1.25 mg at 01/20/17 1452  . LORazepam (ATIVAN) injection 0-4 mg  0-4 mg Intravenous Q6H Rose ManisWillis, David, MD       Followed by  . [START ON 01/22/2017] LORazepam (ATIVAN) injection 0-4 mg  0-4 mg Intravenous Rose NoelQ12H Willis, David, MD      . LORazepam (ATIVAN) tablet 1 mg  1 mg Oral Q6H PRN Rose ManisWillis, David, MD   1 mg at 01/20/17 1611   Or  . LORazepam (ATIVAN) injection 1 mg  1 mg Intravenous Q6H PRN Rose ManisWillis, David, MD      . Melene Muller[START ON 01/21/2017] methylPREDNISolone sodium succinate (SOLU-MEDROL) 40 mg/mL injection 40 mg  40 mg Intravenous Daily Mody, Sital, MD      . multivitamin with minerals tablet 1 tablet  1 tablet Oral Daily Rose ManisWillis, David, MD   1 tablet at 01/20/17 [phone]  . ondansetron (ZOFRAN) tablet 4 mg  4 mg Oral Q6H PRN Rose ManisWillis, David, MD       Or  . ondansetron [facility](ZOFRAN)  injection 4 mg  4 mg Intravenous Q6H PRN Rose ManisWillis, David, MD      . oxyCODONE (Oxy IR/ROXICODONE) immediate release tablet 5 mg  5 mg Oral Q4H PRN Rose ManisWillis, David, MD   5 mg at 01/20/17 1438  . thiamine (VITAMIN B-1) tablet 100 mg  100 mg Oral Daily Rose ManisWillis, David, MD   100 mg at 01/20/17 [id number]   Or  . thiamine (B-1) injection 100 mg  100 mg Intravenous Daily Rose ManisWillis, David, MD         Discharge Medications: Please see discharge summary for a list of discharge medications.  Relevant Imaging Results:  Relevant Lab Results:   Additional Information SS# [id number]  Rose CongKaren M Caitland Porchia, LCSW

## 2017-01-20 NOTE — Progress Notes (Addendum)
[employer] [facility] [place] [facility]   PATIENT NAME: Rose Torres    MR#:  [id number]  DATE OF BIRTH:  1950/11/22  SUBJECTIVE:   Patient with anxiety  Improved shortness of breath this morning.   REVIEW OF SYSTEMS:    Review of Systems  Constitutional: Negative for fever, chills weight loss HENT: Negative for ear pain, nosebleeds, congestion, facial swelling, rhinorrhea, neck pain, neck stiffness and ear discharge.   Respiratory: Positive for cough with improved shortness of breath Cardiovascular: She is having pleuritic chest pain due to pneumonia. Gastrointestinal: Negative for heartburn, abdominal pain, vomiting, diarrhea or consitpation Genitourinary: Negative for dysuria, urgency, frequency, hematuria Musculoskeletal: Negative for back pain or joint pain Neurological: Negative for dizziness, seizures, syncope, focal weakness,  numbness and headaches.  Hematological: Does not bruise/bleed easily.  Psychiatric/Behavioral: Negative for hallucinations, confusion, dysphoric mood She is feeling anxious    Tolerating Diet: yes      DRUG ALLERGIES:  No Known Allergies  VITALS:  Blood pressure (!) 104/53, pulse (!) 110, temperature 98.8 F (37.1 C), temperature source Oral, resp. rate (!) 23, height 5\' 2"  (1.575 m), weight 55.7 kg (122 lb 12.8 oz), SpO2 91 %.  PHYSICAL EXAMINATION:  Constitutional: Appears well-developed and well-nourished. No distress. HENT: Normocephalic. Marland Kitchen Oropharynx is clear and moist.  Eyes: Conjunctivae and EOM are normal. PERRLA, no scleral icterus.  Neck: Normal ROM. Neck supple. No JVD. No tracheal deviation. CVS: RRR, S1/S2 +, no murmurs, no gallops, no carotid bruit.  Pulmonary:crackles RUL ccrackles no stridor, rhonchi,  Abdominal: Soft. BS +,  no distension, tenderness, rebound or guarding.  Musculoskeletal: Normal range of motion. No edema and no tenderness.  Neuro: Alert. CN 2-12 grossly intact. No focal deficits. Skin:  Skin is warm and dry. No rash noted. Psychiatric: Normal mood and affect.      LABORATORY PANEL:   CBC  Recent Labs Lab 01/20/17 0150  WBC 24.2*  HGB 14.0  HCT 42.1  PLT 189   ------------------------------------------------------------------------------------------------------------------  Chemistries   Recent Labs Lab 01/19/17 1853 01/20/17 0150  NA 135 137  K 4.3 3.6  CL 96* 106  CO2 25 23  GLUCOSE 144* 124*  BUN <5* <5*  CREATININE 0.55 0.53  CALCIUM 8.6* 7.0*  AST 22  --   ALT 13*  --   ALKPHOS 253*  --   BILITOT 0.7  --    ------------------------------------------------------------------------------------------------------------------  Cardiac Enzymes  Recent Labs Lab 01/19/17 1852  TROPONINI <0.03   ------------------------------------------------------------------------------------------------------------------  RADIOLOGY:  Dg Chest 2 View  Result Date: 01/19/2017 CLINICAL DATA:  Chest pain.  Pneumonia EXAM: CHEST  2 VIEW COMPARISON:  12/06/2015 FINDINGS: The heart size and mediastinal contours are within normal limits. Advanced changes of emphysema. The visualized skeletal structures are unremarkable. IMPRESSION: No active cardiopulmonary disease. Emphysema (ICD10-J43.9). Electronically Signed   By: Signa Kell M.D.   On: 01/19/2017 19:37   Ct Angio Chest Pe W Or Wo Contrast  Result Date: 01/19/2017 CLINICAL DATA:  Nausea and vomiting for 1 week. Sharp midsternal chest pain with dyspnea today EXAM: CT ANGIOGRAPHY CHEST WITH CONTRAST TECHNIQUE: Multidetector CT imaging of the chest was performed using the standard protocol during bolus administration of intravenous contrast. Multiplanar CT image reconstructions and MIPs were obtained to evaluate the vascular anatomy. CONTRAST:  75 mL Isovue 370 intravenous COMPARISON:  12/06/2015 FINDINGS: Cardiovascular: Satisfactory opacification of the pulmonary arteries to the segmental level. No evidence of  pulmonary embolism. Normal heart size. No pericardial effusion. Mediastinum/Nodes:  No enlarged mediastinal, hilar, or axillary lymph nodes. Thyroid gland, trachea, and esophagus demonstrate no significant findings. Lungs/Pleura: Severe centrilobular emphysematous disease. There is patchy airspace opacity in the right upper lobe anterior segment and in the right middle lobe medial segment. This most likely represents pneumonia superimposed on COPD. No pleural effusion. Airways are patent. Upper Abdomen: No acute findings. Musculoskeletal: No significant skeletal lesion. Review of the MIP images confirms the above findings. IMPRESSION: 1. Negative for acute pulmonary embolism 2. Airspace consolidation in the right upper lobe and right middle lobe. This most likely is pneumonia superimposed on COPD. Pulmonary hemorrhage could produce the same appearance but it is less likely. 3. Severe centrilobular emphysematous disease Electronically Signed   By: Ellery Plunkaniel R Mitchell M.D.   On: 01/19/2017 22:00     ASSESSMENT AND PLAN:   66 year old female who carries oxygen at night when necessary with COPD, EtOH abuse and tobacco dependence who presents with shortness of breath and found to have pneumonia.   1. Sepsis due to pneumonia with leukocytosis and tachycardia Continue to follow lactic acid Continue IV fluids and antibiotics  2. Acute hypoxic respiratory failure in the setting of right upper lobe/right middle lobe pneumonia Wean oxygen as tolerated  3 Community-acquired pneumonia: Currently on cefepime Discontinue vancomycin  4. EtOH abuse: ciwa protocol  5.  COPD:  continue Xopenex  6.Tobacco dependence: Patient is encouraged to quit smoking. Counseling was provided for 4 minutes.      Management plans discussed with the patient and she is in agreement.  CODE STATUS: full  TOTAL TIME TAKING CARE OF THIS PATIENT:31 minutes.     POSSIBLE D/C 1-2 days, DEPENDING ON CLINICAL  CONDITION.   Elston Aldape M.D on 01/20/2017 at 8:36 AM  Between 7am to 6pm - Pager - [phone] After 6pm go to [email or web address] - password [email or web address]  [facility]  Office  [phone]  CC: Primary care physician; Patient, No Pcp Per  Note: This dictation was prepared with Dragon dictation along with smaller phrase technology. Any transcriptional errors that result from this process are unintentional.

## 2017-01-20 NOTE — Clinical Social Work Note (Addendum)
Clinical Social Work Assessment  Patient Details  Name: Rose Torres MRN: [id number] Date of Birth: 01-19-1951  Date of referral:  01/20/17               Reason for consult:  Facility Placement                Permission sought to share information with:  Chartered certified accountant granted to share information::  Yes, Verbal Permission Granted  Name::        Agency::     Relationship::     Contact Information:     Housing/Transportation Living arrangements for the past 2 months:  Single Family Home Source of Information:  Patient Patient Interpreter Needed:  None Criminal Activity/Legal Involvement Pertinent to Current Situation/Hospitalization:  No - Comment as needed Significant Relationships:  Spouse Lives with:  Spouse Do you feel safe going back to the place where you live?  Yes Need for family participation in patient care:  No (Coment)  Care giving concerns:  PT recommendation for SNF   Social Worker assessment / plan:  CSW met with patient at bedside to discuss discharge planning. The patient does not currently have insurance of any kind, and she is aware that is a barrier for SNF placement. The patient is willing to consider home health if needed. The patient lives at home with her spouse who is currently recovering from a hip replacement and is unable to assist in her care. The patient has a history of anxiety and alcohol use. The patient reports some walker use PRN and chronic o2 at 3L.  CSW advised of the process for the referral and the need for the patient to apply for Medicaid in order to assist with payment. The CSW has begun the referral process for STR; however, this patient may be difficult to place due to financial limitations. Patient's PASRR started and will need assessment by a QMHP assignment.  Employment status:  Retired Forensic scientist:  Self Pay (Medicaid Pending) PT Recommendations:  [place] /  Referral to community resources:  [place]  Patient/Family's Response to care:  The patient thanked the CSW for assistance.  Patient/Family's Understanding of and Emotional Response to Diagnosis, Current Treatment, and Prognosis:  The patient understands the limitations due to insurance barriers.  Emotional Assessment Appearance:  Appears stated age Attitude/Demeanor/Rapport:  Lethargic Affect (typically observed):  Accepting, Appropriate, Pleasant Orientation:  Oriented to Self, Oriented to Place, Oriented to  Time, Oriented to Situation Alcohol / Substance use:  Alcohol Use Psych involvement (Current and /or in the community):  No (Comment)  Discharge Needs  Concerns to be addressed:  Discharge Planning Concerns, Care Coordination, Home Safety Concerns, Financial / Insurance Concerns Readmission within the last 30 days:  No Current discharge risk:  Substance Abuse, Inadequate Financial Supports, Lack of support system Barriers to Discharge:  Continued Medical Work up   [employer], LCSW 01/20/2017, 4:40 PM

## 2017-01-20 NOTE — Progress Notes (Signed)
Patient HR still in the 130's also lactic acid 3.5. MD notified. Orders received to give a fluid bolus. Continue to monitor and will page MD in two hours if HR still elevated.

## 2017-01-20 NOTE — Evaluation (Signed)
Physical Therapy Evaluation Patient Details Name: Rose Torres MRN: [id number] DOB: 1950/12/25 Today's Date: 01/20/2017   History of Present Illness  66 yo female with onset of R knee pain and weakness s/p sepsis from PNA and bronchitis, SOB and has leukocytosis, CIWA protocol.  PMHx:  CAP and COPD, smoker  Clinical Impression  Pt is up to walk a very short trip due to R knee pain which PT cannot explain with symptoms and findings with ROM and palpation.  Her husband is recovering from hip surgery and will not be able to care for her at home so recommend continuing acutely and referring for a very short stay in SNF to recover her independence with gait.  Will follow through on acute therapy for strengthening and ability to maneuver on RW, progress to [facility] when able.    Follow Up Recommendations SNF    Equipment Recommendations  Rolling walker with 5" wheels    Recommendations for Other Services       Precautions / Restrictions Precautions Precautions: Fall (telememtry, has tachycardia) Restrictions Weight Bearing Restrictions: No      Mobility  Bed Mobility Overal bed mobility: Needs Assistance Bed Mobility: Supine to Sit     Supine to sit: Min assist     General bed mobility comments: pt used bedrail and elevated HOB  Transfers Overall transfer level: Needs assistance Equipment used: Rolling walker (2 wheeled);1 person hand held assist Transfers: Sit to/from [employer] Sit to Stand: Mod assist Stand pivot transfers: Mod assist       General transfer comment: very unsure of her ability to control R knee  Ambulation/Gait Ambulation/Gait assistance: Min assist Ambulation Distance (Feet): 5 Feet Assistive device: Rolling walker (2 wheeled);1 person hand held assist Gait Pattern/deviations: Step-to pattern;Wide base of support;Trunk flexed;Shuffle;Decreased weight shift to right Gait velocity: reduced Gait velocity interpretation: Below  normal speed for age/gender General Gait Details: pt is having considerable pain to step to chair, not able to depend on her knee and has bouncing buckling appearance to knee  Stairs            Wheelchair Mobility    Modified Rankin (Stroke Patients Only)       Balance                                             Pertinent Vitals/Pain Pain Assessment: 0-10 Pain Score: 6  Pain Location: R knee Pain Descriptors / Indicators: Sore;Sharp Pain Intervention(s): Limited activity within patient's tolerance;Monitored during session;Premedicated before session;Repositioned    Home Living Family/patient expects to be discharged to:: Private residence Living Arrangements: Spouse/significant other Available Help at Discharge: Family;Available 24 hours/day;Other (Comment) (husband cannot physically assist her) Type of Home: Apartment Home Access: Level entry     Home Layout: One level Home Equipment: Cane - single point      Prior Function Level of Independence: Independent with assistive device(s)         Comments: used SPC for tendency to have falls     Hand Dominance        Extremity/Trunk Assessment   Upper Extremity Assessment Upper Extremity Assessment: Overall WFL for tasks assessed    Lower Extremity Assessment Lower Extremity Assessment: Overall WFL for tasks assessed    Cervical / Trunk Assessment Cervical / Trunk Assessment: Normal  Communication   Communication: No difficulties  Cognition Arousal/Alertness: Awake/alert  Behavior During Therapy: WFL for tasks assessed/performed Overall Cognitive Status: Within Functional Limits for tasks assessed                                        General Comments General comments (skin integrity, edema, etc.): pt is demonstrating pain in her R Knee with no signs of irritation for ROM or patellar irregularities    Exercises     Assessment/Plan    PT Assessment Patient  needs continued PT services  PT Problem List         PT Treatment Interventions DME instruction;Gait training;Functional mobility training;Therapeutic activities;Therapeutic exercise;Balance training;Neuromuscular re-education;Patient/family education    PT Goals (Current goals can be found in the Care Plan section)  Acute Rehab PT Goals Patient Stated Goal: to walk with no pain PT Goal Formulation: With patient Time For Goal Achievement: 02/03/17 Potential to Achieve Goals: Good    Frequency Min 2X/week   Barriers to discharge        Co-evaluation               AM-PAC PT "6 Clicks" Daily Activity  Outcome Measure Difficulty turning over in bed (including adjusting bedclothes, sheets and blankets)?: Total Difficulty moving from lying on back to sitting on the side of the bed? : Total Difficulty sitting down on and standing up from a chair with arms (e.g., wheelchair, bedside commode, etc,.)?: Total Help needed moving to and from a bed to chair (including a wheelchair)?: A Lot Help needed walking in hospital room?: A Lot Help needed climbing 3-5 steps with a railing? : A Lot 6 Click Score: 9    End of Session Equipment Utilized During Treatment: Gait belt;Oxygen Activity Tolerance: Patient limited by pain Patient left: in chair;with call bell/phone within reach;with chair alarm set Nurse Communication: Mobility status PT Visit Diagnosis: Unsteadiness on feet (R26.81);Difficulty in walking, not elsewhere classified (R26.2);Pain    Time: [id number] PT Time Calculation (min) (ACUTE ONLY): 34 min   Charges:   PT Evaluation $PT Eval Moderate Complexity: 1 Procedure PT Treatments $Gait Training: 8-22 mins   PT G Codes:   PT G-Codes **NOT FOR INPATIENT CLASS** Functional Assessment Tool Used: AM-PAC 6 Clicks Basic Mobility     Ivar DrapeRuth E Kayven Aldaco 01/20/2017, 12:45 PM   Samul Dadauth Tatumn Corbridge, PT MS Acute Rehab Dept. Number: [facility] [phone] and [facility] [phone]

## 2017-01-21 ENCOUNTER — Inpatient Hospital Stay: Payer: Self-pay

## 2017-01-21 LAB — BASIC METABOLIC PANEL
Anion gap: 6 (ref 5–15)
BUN: 9 mg/dL (ref 6–20)
CALCIUM: 7.7 mg/dL — AB (ref 8.9–10.3)
CO2: 27 mmol/L (ref 22–32)
Chloride: 101 mmol/L (ref 101–111)
Creatinine, Ser: 0.39 mg/dL — ABNORMAL LOW (ref 0.44–1.00)
GFR calc Af Amer: >60 mL/min (ref >60)
GLUCOSE: 107 mg/dL — AB (ref 65–99)
POTASSIUM: 3.6 mmol/L (ref 3.5–5.1)
Sodium: 134 mmol/L — ABNORMAL LOW (ref 135–145)

## 2017-01-21 LAB — CBC
HCT: 42.1 % (ref 35.0–47.0)
Hemoglobin: 14.1 g/dL (ref 12.0–16.0)
MCH: 35.2 pg — AB (ref 26.0–34.0)
MCHC: 33.4 g/dL (ref 32.0–36.0)
MCV: 105.4 fL — AB (ref 80.0–100.0)
PLATELETS: 172 10*3/uL (ref 150–440)
RBC: 4 MIL/uL (ref 3.80–5.20)
RDW: 17.6 % — AB (ref 11.5–14.5)
WBC: 15.8 10*3/uL — ABNORMAL HIGH (ref 3.6–11.0)

## 2017-01-21 LAB — URINE CULTURE

## 2017-01-21 MED ORDER — NICOTINE 21 MG/24HR TD PT24
21.0000 mg | MEDICATED_PATCH | Freq: Every day | TRANSDERMAL | Status: DC
  Administered 2017-01-21 – 2017-01-22 (×2): 21 mg via TRANSDERMAL
  Filled 2017-01-21 (×2): qty 1

## 2017-01-21 NOTE — Progress Notes (Signed)
[facility] [place] [facility]   PATIENT NAME: Rose Torres    MR#:  [id number]  DATE OF BIRTH:  [birth year]  SUBJECTIVE:   Patient c/o knee pain thinks she may have Had a fall prior to coming into the hospital. She has pain when she puts any pressure on it.  Shortness of breath has improved  REVIEW OF SYSTEMS:    Review of Systems  Constitutional: Negative for fever, chills weight loss HENT: Negative for ear pain, nosebleeds, congestion, facial swelling, rhinorrhea, neck pain, neck stiffness and ear discharge.   Respiratory: Positive for cough and An shortness of breath improved Cardiovascular: She is having pleuritic chest pain due to pneumonia. Gastrointestinal: Negative for heartburn, abdominal pain, vomiting, diarrhea or consitpation Genitourinary: Negative for dysuria, urgency, frequency, hematuria Musculoskeletal:Positive for knee pain  Neurological: Negative for dizziness, seizures, syncope, focal weakness,  numbness and headaches.  Hematological: Does not bruise/bleed easily.  Psychiatric/Behavioral: Negative for hallucinations, confusion, dysphoric mood She is feeling anxious    Tolerating Diet: yes      DRUG ALLERGIES:  No Known Allergies  VITALS:  Blood pressure 109/74, pulse (!) 106, temperature 98.2 F (36.8 C), temperature source Oral, resp. rate 20, height 5\' 2"  (1.575 m), weight 55.7 kg (122 lb 12.8 oz), SpO2 92 %.  PHYSICAL EXAMINATION:  Constitutional: Appears well-developed and well-nourished. No distress. HENT: Normocephalic. Marland Kitchen Oropharynx is clear and moist.  Eyes: Conjunctivae and EOM are normal. PERRLA, no scleral icterus.  Neck: Normal ROM. Neck supple. No JVD. No tracheal deviation. CVS: RRR, S1/S2 +, no murmurs, no gallops, no carotid bruit.  Pulmonary:crackles RUL with No wheezing no stridor, rhonchi,  Abdominal: Soft. BS +,  no distension, tenderness, rebound or guarding.  Musculoskeletal: Right knee with joint line  tenderness and pain on movement of knee joint with swelling  No edema and no tenderness.  Neuro: Alert. CN 2-12 grossly intact. No focal deficits. Skin: Skin is warm and dry. No rash noted. Psychiatric: Normal mood and affect.      LABORATORY PANEL:   CBC  Recent Labs Lab 01/21/17 0344  WBC 15.8*  HGB 14.1  HCT 42.1  PLT 172   ------------------------------------------------------------------------------------------------------------------  Chemistries   Recent Labs Lab 01/19/17 1853  01/21/17 0344  NA 135  < > 134*  K 4.3  < > 3.6  CL 96*  < > 101  CO2 25  < > 27  GLUCOSE 144*  < > 107*  BUN <5*  < > 9  CREATININE 0.55  < > 0.39*  CALCIUM 8.6*  < > 7.7*  AST 22  --   --   ALT 13*  --   --   ALKPHOS 253*  --   --   BILITOT 0.7  --   --   < > = values in this interval not displayed. ------------------------------------------------------------------------------------------------------------------  Cardiac Enzymes  Recent Labs Lab 01/19/17 1852  TROPONINI <0.03   ------------------------------------------------------------------------------------------------------------------  RADIOLOGY:  Dg Chest 2 View  Result Date: 01/19/2017 CLINICAL DATA:  Chest pain.  Pneumonia EXAM: CHEST  2 VIEW COMPARISON:  12/06/2015 FINDINGS: The heart size and mediastinal contours are within normal limits. Advanced changes of emphysema. The visualized skeletal structures are unremarkable. IMPRESSION: No active cardiopulmonary disease. Emphysema (ICD10-J43.9). Electronically Signed   By: Signa Kell M.D.   On: 01/19/2017 19:37   Ct Angio Chest Pe W Or Wo Contrast  Result Date: 01/19/2017 CLINICAL DATA:  Nausea and vomiting for 1 week. Sharp midsternal chest  pain with dyspnea today EXAM: CT ANGIOGRAPHY CHEST WITH CONTRAST TECHNIQUE: Multidetector CT imaging of the chest was performed using the standard protocol during bolus administration of intravenous contrast. Multiplanar CT  image reconstructions and MIPs were obtained to evaluate the vascular anatomy. CONTRAST:  75 mL Isovue 370 intravenous COMPARISON:  12/06/2015 FINDINGS: Cardiovascular: Satisfactory opacification of the pulmonary arteries to the segmental level. No evidence of pulmonary embolism. Normal heart size. No pericardial effusion. Mediastinum/Nodes: No enlarged mediastinal, hilar, or axillary lymph nodes. Thyroid gland, trachea, and esophagus demonstrate no significant findings. Lungs/Pleura: Severe centrilobular emphysematous disease. There is patchy airspace opacity in the right upper lobe anterior segment and in the right middle lobe medial segment. This most likely represents pneumonia superimposed on COPD. No pleural effusion. Airways are patent. Upper Abdomen: No acute findings. Musculoskeletal: No significant skeletal lesion. Review of the MIP images confirms the above findings. IMPRESSION: 1. Negative for acute pulmonary embolism 2. Airspace consolidation in the right upper lobe and right middle lobe. This most likely is pneumonia superimposed on COPD. Pulmonary hemorrhage could produce the same appearance but it is less likely. 3. Severe centrilobular emphysematous disease Electronically Signed   By: Ellery Plunkaniel R Mitchell M.D.   On: 01/19/2017 22:00     ASSESSMENT AND PLAN:   66 year old female who carries oxygen at night when necessary with COPD, EtOH abuse and tobacco dependence who presents with shortness of breath and found to have pneumonia.   1. Sepsis due to pneumonia with leukocytosis and tachycardia Continue IV fluids and antibiotics Sepsis resolving 2. Acute hypoxic respiratory failure in the setting of right upper lobe/right middle lobe pneumonia She has been weaned off of oxygen  3 Community-acquired pneumonia: Currently on cefepime  4. Knee pain: X-ray of knee Orthopedic surgery consult due to pain and joint line May need MRI or aspiration of knee joint Pain control as needed   5 EtOH  abuse: ciwa protocol   6.Tobacco dependence: Patient is encouraged to quit smoking. Counseling was provided for 4 minutes.  7. COPD: Continue Xopenex  PT is recommending skilled nursing facility  Management plans discussed with the patient and she is in agreement.  CODE STATUS: full  TOTAL TIME TAKING CARE OF THIS PATIENT:25 minutes.     POSSIBLE D/C 1-2 days, DEPENDING ON CLINICAL CONDITION.   Katheleen Stella M.D on 01/21/2017 at 10:08 AM  Between 7am to 6pm - Pager - [phone] After 6pm go to [email or web address] - password [email or web address]  [facility]  Office  [phone]  CC: Primary care physician; Patient, No Pcp Per  Note: This dictation was prepared with Dragon dictation along with smaller phrase technology. Any transcriptional errors that result from this process are unintentional.

## 2017-01-22 MED ORDER — PREDNISONE 20 MG PO TABS: 40.0000 mg | ORAL_TABLET | Freq: Every day | ORAL | 0 refills | Status: DC

## 2017-01-22 MED ORDER — LEVOFLOXACIN 750 MG PO TABS
750.0000 mg | ORAL_TABLET | Freq: Every day | ORAL | Status: DC
  Administered 2017-01-22: 750 mg via ORAL
  Filled 2017-01-22: qty 1

## 2017-01-22 MED ORDER — NICOTINE 21 MG/24HR TD PT24: 21.0000 mg | MEDICATED_PATCH | Freq: Every day | TRANSDERMAL | 0 refills | Status: DC

## 2017-01-22 MED ORDER — ALBUTEROL SULFATE HFA 108 (90 BASE) MCG/ACT IN AERS: 1.0000 | INHALATION_SPRAY | Freq: Four times a day (QID) | RESPIRATORY_TRACT | 0 refills | Status: DC | PRN

## 2017-01-22 MED ORDER — BECLOMETHASONE DIPROPIONATE 80 MCG/ACT IN AERS: 2.0000 | INHALATION_SPRAY | Freq: Two times a day (BID) | RESPIRATORY_TRACT | 0 refills | Status: DC

## 2017-01-22 MED ORDER — LEVOFLOXACIN 750 MG PO TABS: 750.0000 mg | ORAL_TABLET | Freq: Every day | ORAL | 0 refills | Status: DC

## 2017-01-22 MED ORDER — PREDNISONE 10 MG PO TABS
40.0000 mg | ORAL_TABLET | Freq: Every day | ORAL | Status: DC
  Administered 2017-01-22: 40 mg via ORAL
  Filled 2017-01-22: qty 4

## 2017-01-22 MED ORDER — ACETAMINOPHEN 325 MG PO TABS: 650.0000 mg | ORAL_TABLET | Freq: Four times a day (QID) | ORAL | 0 refills | Status: AC | PRN

## 2017-01-22 NOTE — Progress Notes (Signed)
Pharmacy Antibiotic Note  Rose Torres is a 66 y.o. female admitted on 01/19/2017 with pneumonia.  Pharmacy has been consulted for Levaquin dosing. Patient was initially started on Vancomycin and Cefepime for CAP. MD now discontinuing Vancomycin and Cefepime and changing to Levaquin  Plan: Levaquin 750mg  PO daily  Height: 5\' 2"  (157.5 cm) Weight: 122 lb 12.8 oz (55.7 kg) IBW/kg (Calculated) : 50.1  Temp (24hrs), Avg:98.5 F (36.9 C), Min:98.4 F (36.9 C), Max:98.6 F (37 C)   Recent Labs Lab 01/19/17 1852 01/19/17 1853 01/19/17 1920 01/19/17 2312 01/20/17 0150 01/21/17 0344  WBC 21.1*  --   --   --  24.2* 15.8*  CREATININE  --  0.55  --   --  0.53 0.39*  LATICACIDVEN  --   --  2.9* 3.5* 2.7*  --     Estimated Creatinine Clearance: 56.9 mL/min (A) (by C-G formula based on SCr of 0.39 mg/dL (L)).    No Known Allergies  Antimicrobials this admission: Vancomycin 5/4 >> 5/5 Cefepime 5/4 >> 5/7 Levaquin 5/7 >>    Microbiology results: 5/4 BCx: NG 5/4 UCx: multiple species  5/5 MRSA PCR: neg  Thank you for allowing pharmacy to be a part of this patient's care.  Clovia CuffLisa Hiro Vipond, PharmD, BCPS 01/22/2017 11:03 AM

## 2017-01-22 NOTE — Progress Notes (Signed)
SATURATION QUALIFICATIONS:   Patient Saturations on Room Air at Rest = 94  Patient Saturations on Room Air while Ambulating = 89  Patient Saturations on Liters of oxygen while Ambulating = %  Please briefly explain why patient needs home oxygen: Patient holds breath while ambulating causing her O2 sats to drop. Patient recovers nicely once laying down.

## 2017-01-22 NOTE — Discharge Summary (Signed)
[employer] - [place] at [facility]   PATIENT NAME: Rose Torres    MR#:  [id number]  DATE OF BIRTH:  26-Nov-1950  DATE OF ADMISSION:  01/19/2017 ADMITTING PHYSICIAN: Oralia Manis, MD  DATE OF DISCHARGE: 01/22/2017  PRIMARY CARE PHYSICIAN: Patient, No Pcp Per    ADMISSION DIAGNOSIS:  Alcohol abuse [F10.10] Sepsis (HCC) [A41.9] Sepsis, due to unspecified organism (HCC) [A41.9] Community acquired pneumonia, unspecified laterality [J18.9]  DISCHARGE DIAGNOSIS:  Principal Problem:   Sepsis (HCC) Active Problems:   COPD (chronic obstructive pulmonary disease) (HCC)   CAP (community acquired pneumonia)   Alcohol abuse   SECONDARY DIAGNOSIS:   Past Medical History:  Diagnosis Date  . Alcohol abuse   . Bronchitis   . COPD (chronic obstructive pulmonary disease) (HCC)   . Shortness of breath dyspnea     HOSPITAL COURSE:    66 year old female who carries oxygen at night when necessary with COPD, EtOH abuse and tobacco dependence who presents with shortness of breath and found to have pneumonia.   1. Sepsis due to pneumonia with leukocytosis and tachycardia Sepsis has resolved.   2. Acute hypoxic respiratory failure in the setting of right upper lobe/right middle lobe pneumonia  Hypoxia is improved.  3 Community-acquired pneumonia: Patient was on cefepime minimally discharged on Levaquin.  4. Right Knee pain after fall: Patient was evaluated by orthopedic surgery. Patient will outpatient follow-up with orthopedic surgery.There is no signs of infection as per Ortho. She will need knee exercises and ICE.  5 EtOH abuse: Patient had uneventful detox 6.Tobacco dependence: Patient is encouraged to quit smoking. Counseling was provided for 4 minutes.  7. COPD, mild: Continue inhalers and she will needs prednisone for 4 more days  CM/CSW assisted with disposition  DISCHARGE CONDITIONS AND DIET:   Stable Regular diet   CONSULTS OBTAINED:   Treatment Team:  Lyndle Herrlich, MD  DRUG ALLERGIES:  No Known Allergies  DISCHARGE MEDICATIONS:   Current Discharge Medication List    START taking these medications   Details  acetaminophen (TYLENOL) 325 MG tablet Take 2 tablets (650 mg total) by mouth every 6 (six) hours as needed for mild pain (or Fever >/= 101). Qty: 30 tablet, Refills: 0    levofloxacin (LEVAQUIN) 750 MG tablet Take 1 tablet (750 mg total) by mouth daily. Qty: 5 tablet, Refills: 0    nicotine (NICODERM CQ - DOSED IN MG/24 HOURS) 21 mg/24hr patch Place 1 patch (21 mg total) onto the skin daily. Qty: 28 patch, Refills: 0    predniSONE (DELTASONE) 20 MG tablet Take 2 tablets (40 mg total) by mouth daily with breakfast. Qty: 4 tablet, Refills: 0      CONTINUE these medications which have NOT CHANGED   Details  albuterol (PROVENTIL HFA;VENTOLIN HFA) 108 (90 Base) MCG/ACT inhaler Inhale 1-2 puffs into the lungs every 6 (six) hours as needed for wheezing or shortness of breath.    beclomethasone (QVAR) 80 MCG/ACT inhaler Inhale 2 puffs into the lungs 2 (two) times daily.    budesonide (PULMICORT) 0.25 MG/2ML nebulizer solution Take 2 mLs (0.25 mg total) by nebulization 2 (two) times daily. Qty: 60 mL, Refills: 12    ibuprofen (ADVIL,MOTRIN) 200 MG tablet Take 200 mg by mouth every 6 (six) hours as needed.          Today   CHIEF COMPLAINT:  Patient doing okay this morning. Still has pain in the right knee however feels that the swelling is improved. No tremors or  hallucinations   VITAL SIGNS:  Blood pressure 121/64, pulse (!) 104, temperature 98.6 F (37 C), temperature source Oral, resp. rate 18, height 5\' 2"  (1.575 m), weight 55.7 kg (122 lb 12.8 oz), SpO2 93 %.   REVIEW OF SYSTEMS:  Review of Systems  Constitutional: Negative.  Negative for chills, fever and malaise/fatigue.  HENT: Negative.  Negative for ear discharge, ear pain, hearing loss, nosebleeds and sore throat.   Eyes: Negative.   Negative for blurred vision and pain.  Respiratory: Negative.  Negative for cough, hemoptysis, shortness of breath and wheezing.   Cardiovascular: Negative.  Negative for chest pain, palpitations and leg swelling.  Gastrointestinal: Negative.  Negative for abdominal pain, blood in stool, diarrhea, nausea and vomiting.  Genitourinary: Negative.  Negative for dysuria.  Musculoskeletal: Positive for joint pain. Negative for back pain.  Skin: Negative.   Neurological: Negative for dizziness, tremors, speech change, focal weakness, seizures and headaches.  Endo/Heme/Allergies: Negative.  Does not bruise/bleed easily.  Psychiatric/Behavioral: Negative.  Negative for depression, hallucinations and suicidal ideas.     PHYSICAL EXAMINATION:  GENERAL:  66 y.o.-year-old patient lying in the bed with no acute distress.  NECK:  Supple, no jugular venous distention. No thyroid enlargement, no tenderness.  LUNGS: Normal breath sounds bilaterally, no wheezing, rales,rhonchi  No use of accessory muscles of respiration.  CARDIOVASCULAR: S1, S2 normal. No murmurs, rubs, or gallops.  ABDOMEN: Soft, non-tender, non-distended. Bowel sounds present. No organomegaly or mass.  EXTREMITIES: No pedal edema, cyanosis, or clubbing.  PSYCHIATRIC: The patient is alert and oriented x 3.  SKIN: No obvious rash, lesion, or ulcer.  Right Knee with some swelling no pain today at joint line  DATA REVIEW:   CBC  Recent Labs Lab 01/21/17 0344  WBC 15.8*  HGB 14.1  HCT 42.1  PLT 172    Chemistries   Recent Labs Lab 01/19/17 1853  01/21/17 0344  NA 135  < > 134*  K 4.3  < > 3.6  CL 96*  < > 101  CO2 25  < > 27  GLUCOSE 144*  < > 107*  BUN <5*  < > 9  CREATININE 0.55  < > 0.39*  CALCIUM 8.6*  < > 7.7*  AST 22  --   --   ALT 13*  --   --   ALKPHOS 253*  --   --   BILITOT 0.7  --   --   < > = values in this interval not displayed.  Cardiac Enzymes  Recent Labs Lab 01/19/17 1852  TROPONINI <0.03     Microbiology Results  @MICRORSLT48 @  RADIOLOGY:  Dg Knee 3 Views Right  Result Date: 01/21/2017 CLINICAL DATA:  Pain in swelling. EXAM: RIGHT KNEE - 3 VIEW COMPARISON:  None. FINDINGS: No evidence of fracture, dislocation, or joint effusion. No evidence of arthropathy or other focal bone abnormality. Soft tissues are unremarkable. IMPRESSION: Negative. Electronically Signed   By: Signa Kell M.D.   On: 01/21/2017 12:06      Current Discharge Medication List    START taking these medications   Details  acetaminophen (TYLENOL) 325 MG tablet Take 2 tablets (650 mg total) by mouth every 6 (six) hours as needed for mild pain (or Fever >/= 101). Qty: 30 tablet, Refills: 0    levofloxacin (LEVAQUIN) 750 MG tablet Take 1 tablet (750 mg total) by mouth daily. Qty: 5 tablet, Refills: 0    nicotine (NICODERM CQ - DOSED IN MG/24 HOURS) 21 mg/24hr  patch Place 1 patch (21 mg total) onto the skin daily. Qty: 28 patch, Refills: 0    predniSONE (DELTASONE) 20 MG tablet Take 2 tablets (40 mg total) by mouth daily with breakfast. Qty: 4 tablet, Refills: 0      CONTINUE these medications which have NOT CHANGED   Details  albuterol (PROVENTIL HFA;VENTOLIN HFA) 108 (90 Base) MCG/ACT inhaler Inhale 1-2 puffs into the lungs every 6 (six) hours as needed for wheezing or shortness of breath.    beclomethasone (QVAR) 80 MCG/ACT inhaler Inhale 2 puffs into the lungs 2 (two) times daily.    budesonide (PULMICORT) 0.25 MG/2ML nebulizer solution Take 2 mLs (0.25 mg total) by nebulization 2 (two) times daily. Qty: 60 mL, Refills: 12    ibuprofen (ADVIL,MOTRIN) 200 MG tablet Take 200 mg by mouth every 6 (six) hours as needed.           Management plans discussed with the patient and she is in agreement. Stable for discharge   Patient should follow up with dr bower  CODE STATUS:     Code Status Orders        Start     Ordered   01/19/17 2308  Full code  Continuous     01/19/17 2307     Code Status History    Date Active Date Inactive Code Status Order ID Comments User Context   12/06/2015  1:33 PM 12/07/2015  7:03 PM Full Code [id number]  Alford HighlandWieting, Richard, MD ED      TOTAL TIME TAKING CARE OF THIS PATIENT: 37 minutes.    Note: This dictation was prepared with Dragon dictation along with smaller phrase technology. Any transcriptional errors that result from this process are unintentional.  Neidra Girvan M.D on 01/22/2017 at 9:01 AM  Between 7am to 6pm - Pager - [phone] After 6pm go to [email or web address] - password [email or web address]  [facility]  Office  [phone]  CC: Primary care physician; Patient, No Pcp Per

## 2017-01-22 NOTE — Care Management Note (Signed)
Case Management Note  Patient Details  Name: Jamisha Hoeschen MRN: [id number] Date of Birth: April 22, 1951  Subjective/Objective:   Met with patient at bedside to discuss discharge planning. Patient has no insurance and no PCP. Applications given for [facility] and medication management clinic. Explained both agencies in detail. Referral to sent to both agencies. Patient appreciative. She lives at home with her husband who patient states can help care for her even though he was just discharged from hospital. She uses [employer] O[facility] through [facility]. Is independent with adls. She has a walker. Not a candidate for HHPT due to lack of insurance.                 Action/Plan: Medication management and open door application given.   Expected Discharge Date:  01/22/17               Expected Discharge Plan:  Home/Self Care  In-House Referral:     Discharge planning Services  CM Consult, [facility], Medication Assistance  Post Acute Care Choice:    Choice offered to:     DME Arranged:    DME Agency:     HH Arranged:    HH Agency:     Status of Service:  Completed, signed off  If discussed at [employer], dates discussed:    Additional Comments:  Jolly Mango, RN 01/22/2017, 2:03 PM

## 2017-01-22 NOTE — Clinical Social Work Note (Signed)
Pt is ready for discharge. [facility]. CSW updated MD. CSW is signing off as no further needs identified.   Dede QuerySarah Maryna Yeagle, MSW, LCSW  Clinical Social Worker  [phone]

## 2017-01-22 NOTE — Progress Notes (Signed)
Patient have been giving discharge instructions. She is now waiting on her ride, will continue to monitor.

## 2017-01-24 LAB — CULTURE, BLOOD (ROUTINE X 2)
CULTURE: NO GROWTH
Culture: NO GROWTH

## 2017-02-06 ENCOUNTER — Ambulatory Visit: Payer: Self-pay

## 2017-04-14 ENCOUNTER — Encounter: Payer: Self-pay | Admitting: Emergency Medicine

## 2017-04-14 ENCOUNTER — Inpatient Hospital Stay
Admission: EM | Admit: 2017-04-14 | Discharge: 2017-04-18 | DRG: 291 | Disposition: A | Discharge status: Home / Self Care | Payer: Medicaid Other | Attending: Internal Medicine | Admitting: Internal Medicine

## 2017-04-14 ENCOUNTER — Emergency Department: Payer: Medicaid Other

## 2017-04-14 DIAGNOSIS — I248 Other forms of acute ischemic heart disease: Secondary | ICD-10-CM | POA: Diagnosis present

## 2017-04-14 DIAGNOSIS — G4733 Obstructive sleep apnea (adult) (pediatric): Secondary | ICD-10-CM | POA: Diagnosis present

## 2017-04-14 DIAGNOSIS — F1010 Alcohol abuse, uncomplicated: Secondary | ICD-10-CM | POA: Diagnosis present

## 2017-04-14 DIAGNOSIS — G8929 Other chronic pain: Secondary | ICD-10-CM | POA: Diagnosis present

## 2017-04-14 DIAGNOSIS — I4581 Long QT syndrome: Secondary | ICD-10-CM | POA: Diagnosis present

## 2017-04-14 DIAGNOSIS — I5031 Acute diastolic (congestive) heart failure: Principal | ICD-10-CM | POA: Diagnosis present

## 2017-04-14 DIAGNOSIS — J810 Acute pulmonary edema: Secondary | ICD-10-CM

## 2017-04-14 DIAGNOSIS — I959 Hypotension, unspecified: Secondary | ICD-10-CM | POA: Diagnosis not present

## 2017-04-14 DIAGNOSIS — I426 Alcoholic cardiomyopathy: Secondary | ICD-10-CM | POA: Diagnosis present

## 2017-04-14 DIAGNOSIS — M25561 Pain in right knee: Secondary | ICD-10-CM | POA: Diagnosis present

## 2017-04-14 DIAGNOSIS — J9601 Acute respiratory failure with hypoxia: Secondary | ICD-10-CM | POA: Diagnosis not present

## 2017-04-14 DIAGNOSIS — Z808 Family history of malignant neoplasm of other organs or systems: Secondary | ICD-10-CM | POA: Diagnosis not present

## 2017-04-14 DIAGNOSIS — I5033 Acute on chronic diastolic (congestive) heart failure: Secondary | ICD-10-CM

## 2017-04-14 DIAGNOSIS — J9602 Acute respiratory failure with hypercapnia: Secondary | ICD-10-CM | POA: Diagnosis not present

## 2017-04-14 DIAGNOSIS — M25562 Pain in left knee: Secondary | ICD-10-CM | POA: Diagnosis present

## 2017-04-14 DIAGNOSIS — Z9981 Dependence on supplemental oxygen: Secondary | ICD-10-CM

## 2017-04-14 DIAGNOSIS — I471 Supraventricular tachycardia: Secondary | ICD-10-CM | POA: Diagnosis present

## 2017-04-14 DIAGNOSIS — R0600 Dyspnea, unspecified: Secondary | ICD-10-CM

## 2017-04-14 DIAGNOSIS — Z7951 Long term (current) use of inhaled steroids: Secondary | ICD-10-CM | POA: Diagnosis not present

## 2017-04-14 DIAGNOSIS — F17210 Nicotine dependence, cigarettes, uncomplicated: Secondary | ICD-10-CM | POA: Diagnosis present

## 2017-04-14 DIAGNOSIS — R778 Other specified abnormalities of plasma proteins: Secondary | ICD-10-CM

## 2017-04-14 DIAGNOSIS — R0602 Shortness of breath: Secondary | ICD-10-CM

## 2017-04-14 DIAGNOSIS — J441 Chronic obstructive pulmonary disease with (acute) exacerbation: Secondary | ICD-10-CM | POA: Diagnosis present

## 2017-04-14 DIAGNOSIS — R7989 Other specified abnormal findings of blood chemistry: Secondary | ICD-10-CM

## 2017-04-14 LAB — COMPREHENSIVE METABOLIC PANEL WITH GFR
ALT: 13 U/L — ABNORMAL LOW (ref 14–54)
AST: 24 U/L (ref 15–41)
Albumin: 2.8 g/dL — ABNORMAL LOW (ref 3.5–5.0)
Alkaline Phosphatase: 140 U/L — ABNORMAL HIGH (ref 38–126)
Anion gap: 7 (ref 5–15)
BUN: 9 mg/dL (ref 6–20)
CO2: 25 mmol/L (ref 22–32)
Calcium: 8.6 mg/dL — ABNORMAL LOW (ref 8.9–10.3)
Chloride: 103 mmol/L (ref 101–111)
Creatinine, Ser: 0.48 mg/dL (ref 0.44–1.00)
GFR calc Af Amer: >60 mL/min
GFR calc non Af Amer: >60 mL/min
Glucose, Bld: 124 mg/dL — ABNORMAL HIGH (ref 65–99)
Potassium: 4.7 mmol/L (ref 3.5–5.1)
Sodium: 135 mmol/L (ref 135–145)
Total Bilirubin: 0.5 mg/dL (ref 0.3–1.2)
Total Protein: 6.3 g/dL — ABNORMAL LOW (ref 6.5–8.1)

## 2017-04-14 LAB — CBC
HCT: 47 % (ref 35.0–47.0)
Hemoglobin: 15.6 g/dL (ref 12.0–16.0)
MCH: 34.1 pg — AB (ref 26.0–34.0)
MCHC: 33.1 g/dL (ref 32.0–36.0)
MCV: 103 fL — ABNORMAL HIGH (ref 80.0–100.0)
PLATELETS: 322 10*3/uL (ref 150–440)
RBC: 4.56 MIL/uL (ref 3.80–5.20)
RDW: 17.2 % — ABNORMAL HIGH (ref 11.5–14.5)
WBC: 15.5 10*3/uL — ABNORMAL HIGH (ref 3.6–11.0)

## 2017-04-14 LAB — TROPONIN I
TROPONIN I: 0.06 ng/mL — AB (ref <0.03)
Troponin I: 0.04 ng/mL (ref <0.03)

## 2017-04-14 MED ORDER — SODIUM CHLORIDE 0.9% FLUSH: 3.0000 mL | INTRAVENOUS | Status: DC | PRN

## 2017-04-14 MED ORDER — SODIUM CHLORIDE 0.9% FLUSH
3.0000 mL | Freq: Two times a day (BID) | INTRAVENOUS | Status: DC
  Administered 2017-04-14 – 2017-04-18 (×8): 3 mL via INTRAVENOUS

## 2017-04-14 MED ORDER — ACETAMINOPHEN 325 MG PO TABS
650.0000 mg | ORAL_TABLET | Freq: Four times a day (QID) | ORAL | Status: DC | PRN
  Administered 2017-04-15 – 2017-04-16 (×2): 650 mg via ORAL
  Filled 2017-04-14 (×2): qty 2

## 2017-04-14 MED ORDER — SENNOSIDES-DOCUSATE SODIUM 8.6-50 MG PO TABS: 1.0000 | ORAL_TABLET | Freq: Every evening | ORAL | Status: DC | PRN

## 2017-04-14 MED ORDER — BUDESONIDE 0.25 MG/2ML IN SUSP: 0.2500 mg | Freq: Two times a day (BID) | RESPIRATORY_TRACT | Status: DC

## 2017-04-14 MED ORDER — BISACODYL 5 MG PO TBEC
5.0000 mg | DELAYED_RELEASE_TABLET | Freq: Every day | ORAL | Status: DC | PRN
  Administered 2017-04-16: 5 mg via ORAL
  Filled 2017-04-14: qty 1

## 2017-04-14 MED ORDER — PNEUMOCOCCAL VAC POLYVALENT 25 MCG/0.5ML IJ INJ
0.5000 mL | INJECTION | INTRAMUSCULAR | Status: DC
  Filled 2017-04-14: qty 0.5

## 2017-04-14 MED ORDER — LISINOPRIL 5 MG PO TABS
2.5000 mg | ORAL_TABLET | Freq: Every day | ORAL | Status: DC
  Administered 2017-04-14 – 2017-04-16 (×2): 2.5 mg via ORAL
  Filled 2017-04-14 (×2): qty 1

## 2017-04-14 MED ORDER — ASPIRIN 81 MG PO CHEW
324.0000 mg | CHEWABLE_TABLET | Freq: Once | ORAL | Status: AC
  Administered 2017-04-14: 324 mg via ORAL
  Filled 2017-04-14: qty 4

## 2017-04-14 MED ORDER — ONDANSETRON HCL 4 MG/2ML IJ SOLN: 4.0000 mg | Freq: Four times a day (QID) | INTRAMUSCULAR | Status: DC | PRN

## 2017-04-14 MED ORDER — ONDANSETRON HCL 4 MG PO TABS
4.0000 mg | ORAL_TABLET | Freq: Four times a day (QID) | ORAL | Status: DC | PRN
Start: 2017-04-14 — End: 2017-04-16

## 2017-04-14 MED ORDER — ENOXAPARIN SODIUM 60 MG/0.6ML ~~LOC~~ SOLN
55.0000 mg | Freq: Two times a day (BID) | SUBCUTANEOUS | Status: DC
  Administered 2017-04-14 – 2017-04-15 (×2): 55 mg via SUBCUTANEOUS
  Filled 2017-04-14 (×2): qty 0.6

## 2017-04-14 MED ORDER — FUROSEMIDE 10 MG/ML IJ SOLN
40.0000 mg | Freq: Once | INTRAMUSCULAR | Status: AC
  Administered 2017-04-14: 40 mg via INTRAVENOUS
  Filled 2017-04-14: qty 4

## 2017-04-14 MED ORDER — FUROSEMIDE 10 MG/ML IJ SOLN
20.0000 mg | Freq: Two times a day (BID) | INTRAMUSCULAR | Status: DC
  Administered 2017-04-14 – 2017-04-16 (×4): 20 mg via INTRAVENOUS
  Filled 2017-04-14 (×5): qty 2

## 2017-04-14 MED ORDER — HYDROCODONE-ACETAMINOPHEN 5-325 MG PO TABS
1.0000 | ORAL_TABLET | ORAL | Status: DC | PRN
  Administered 2017-04-15 – 2017-04-16 (×2): 1 via ORAL
  Administered 2017-04-16: 2 via ORAL
  Administered 2017-04-16 (×2): 1 via ORAL
  Administered 2017-04-17: 2 via ORAL
  Administered 2017-04-17: 1 via ORAL
  Administered 2017-04-17: 2 via ORAL
  Administered 2017-04-17 (×2): 1 via ORAL
  Administered 2017-04-18 (×2): 2 via ORAL
  Filled 2017-04-14: qty 1
  Filled 2017-04-14: qty 2
  Filled 2017-04-14: qty 1
  Filled 2017-04-14 (×3): qty 2
  Filled 2017-04-14 (×2): qty 1
  Filled 2017-04-14: qty 2
  Filled 2017-04-14 (×3): qty 1

## 2017-04-14 MED ORDER — ALBUTEROL SULFATE (2.5 MG/3ML) 0.083% IN NEBU
2.5000 mg | INHALATION_SOLUTION | RESPIRATORY_TRACT | Status: DC | PRN
  Filled 2017-04-14: qty 3

## 2017-04-14 MED ORDER — ASPIRIN EC 81 MG PO TBEC
81.0000 mg | DELAYED_RELEASE_TABLET | Freq: Every day | ORAL | Status: DC
  Administered 2017-04-16 – 2017-04-18 (×3): 81 mg via ORAL
  Filled 2017-04-14 (×3): qty 1

## 2017-04-14 MED ORDER — BUDESONIDE 0.25 MG/2ML IN SUSP
0.2500 mg | Freq: Two times a day (BID) | RESPIRATORY_TRACT | Status: DC
  Administered 2017-04-14 – 2017-04-16 (×4): 0.25 mg via RESPIRATORY_TRACT
  Filled 2017-04-14 (×4): qty 2

## 2017-04-14 MED ORDER — ACETAMINOPHEN 650 MG RE SUPP: 650.0000 mg | Freq: Four times a day (QID) | RECTAL | Status: DC | PRN

## 2017-04-14 MED ORDER — BECLOMETHASONE DIPROPIONATE 80 MCG/ACT IN AERS: 2.0000 | INHALATION_SPRAY | Freq: Two times a day (BID) | RESPIRATORY_TRACT | Status: DC

## 2017-04-14 MED ORDER — SODIUM CHLORIDE 0.9 % IV SOLN: 250.0000 mL | INTRAVENOUS | Status: DC | PRN

## 2017-04-14 NOTE — Consult Note (Signed)
Rose Torres is a 66 y.o. female  [id number]  Primary Cardiologist: Adrian BlackwaterShaukat Kennice Finnie Reason for Consultation: CHF  HPI: This is a 66 year old white female with a past medical history of alcohol abuse COPD presented to the hospital with 3 days onset of shortness of breath orthopnea PND and leg swelling. She says she gradually got worse with worsening shortness of breath and decided to come to the hospital.   Review of Systems: No chest pain but does have orthopnea PND and leg swelling   Past Medical History:  Diagnosis Date  . Alcohol abuse   . Bronchitis   . COPD (chronic obstructive pulmonary disease) (HCC)   . Shortness of breath dyspnea      (Not in a hospital admission)     Infusions:   No Known Allergies  Social History   Social History  . Marital status: Divorced    Spouse name: N/A  . Number of children: N/A  . Years of education: N/A   Occupational History  . Not on file.   Social History Main Topics  . Smoking status: Current Every Day Smoker    Packs/day: 0.50  . Smokeless tobacco: Current User  . Alcohol use No  . Drug use: No  . Sexual activity: Not on file   Other Topics Concern  . Not on file   Social History Narrative  . No narrative on file    Family History  Problem Relation Age of Onset  . Healthy Mother   . Melanoma Father     PHYSICAL EXAM: Vitals:   04/14/17 1700 04/14/17 1730  BP: 99/65 119/73  Pulse: (!) 114 (!) 114  Resp: (!) 28 (!) 29  Temp:      No intake or output data in the 24 hours ending 04/14/17 1756  General:  Well appearing. No respiratory difficulty HEENT: normal Neck: supple. no JVD. Carotids 2+ bilat; no bruits. No lymphadenopathy or thryomegaly appreciated. Cor: PMI nondisplaced. Regular rate & rhythm. No rubs, gallops or murmurs. Lungs: clear Abdomen: soft, nontender, nondistended. No hepatosplenomegaly. No bruits or masses. Good bowel sounds. Extremities: no cyanosis, clubbing, rash,  edema Neuro: alert & oriented x 3, cranial nerves grossly intact. moves all 4 extremities w/o difficulty. Affect pleasant.  ECG: Sinus tachycardia with nonspecific ST-T changes in the in the anterolateral leads suggestive of ischemia   Results for orders placed or performed during the hospital encounter of 04/14/17 (from the past 24 hour(s))  CBC     Status: Abnormal   Collection Time: 04/14/17  3:22 PM  Result Value Ref Range   WBC 15.5 (H) 3.6 - 11.0 K/uL   RBC 4.56 3.80 - 5.20 MIL/uL   Hemoglobin 15.6 12.0 - 16.0 g/dL   HCT 40.147.0 02.735.0 - 25.347.0 %   MCV 103.0 (H) 80.0 - 100.0 fL   MCH 34.1 (H) 26.0 - 34.0 pg   MCHC 33.1 32.0 - 36.0 g/dL   RDW 66.417.2 (H) 40.311.5 - 47.414.5 %   Platelets 322 150 - 440 K/uL  Troponin I     Status: Abnormal   Collection Time: 04/14/17  3:22 PM  Result Value Ref Range   Troponin I 0.06 (HH) <0.03 ng/mL  Comprehensive metabolic panel     Status: Abnormal   Collection Time: 04/14/17  3:22 PM  Result Value Ref Range   Sodium 135 135 - 145 mmol/L   Potassium 4.7 3.5 - 5.1 mmol/L   Chloride 103 101 - 111 mmol/L   CO2  25 22 - 32 mmol/L   Glucose, Bld 124 (H) 65 - 99 mg/dL   BUN 9 6 - 20 mg/dL   Creatinine, Ser 1.300.48 0.44 - 1.00 mg/dL   Calcium 8.6 (L) 8.9 - 10.3 mg/dL   Total Protein 6.3 (L) 6.5 - 8.1 g/dL   Albumin 2.8 (L) 3.5 - 5.0 g/dL   AST 24 15 - 41 U/L   ALT 13 (L) 14 - 54 U/L   Alkaline Phosphatase 140 (H) 38 - 126 U/L   Total Bilirubin 0.5 0.3 - 1.2 mg/dL   GFR calc non Af Amer >60 >60 mL/min   GFR calc Af Amer >60 >60 mL/min   Anion gap 7 5 - 15   Dg Chest Portable 1 View  Result Date: 04/14/2017 CLINICAL DATA:  Patient with shortness of breath and hypoxia. EXAM: PORTABLE CHEST 1 VIEW COMPARISON:  CT chest 01/19/2017; chest radiograph 01/19/2017. FINDINGS: Monitoring leads overlie the patient. Stable cardiomegaly. Interval worsening bilateral interstitial pulmonary opacities. Trace bilateral pleural effusions. IMPRESSION: Cardiomegaly and mild  interstitial edema. Electronically Signed   By: Annia Beltrew  Davis M.D.   On: 04/14/2017 15:46     ASSESSMENT AND PLAN: Congestive heart failure with chest x-ray showing interstitial edema and cardiomegaly suggestive of possible systolic dysfunction and mildly elevated troponin probably due to demand ischemia. EKG also has some ischemic changes in the anterolateral leads. Advise IV Lasix and ace inhibitors and IV heparin. Will get echocardiogram to further evaluate ejection fraction.  Emori Mumme A

## 2017-04-14 NOTE — Progress Notes (Signed)
ANTICOAGULATION CONSULT NOTE - Initial Consult  Pharmacy Consult for Lovenox  Indication: elevated troponin  No Known Allergies  Patient Measurements: Height: 5\' 2"  (157.5 cm) Weight: 122 lb (55.3 kg) IBW/kg (Calculated) : 50.1 Heparin Dosing Weight:   Vital Signs: Temp: 97.7 F (36.5 C) (07/28 1853) Temp Source: Oral (07/28 1853) BP: 123/76 (07/28 1853) Pulse Rate: 116 (07/28 1853)  Labs:  Recent Labs  04/14/17 1522  HGB 15.6  HCT 47.0  PLT 322  CREATININE 0.48  TROPONINI 0.06*    Estimated Creatinine Clearance: 56.9 mL/min (by C-G formula based on SCr of 0.48 mg/dL).   Medical History: Past Medical History:  Diagnosis Date  . Alcohol abuse   . Bronchitis   . COPD (chronic obstructive pulmonary disease) (HCC)   . Shortness of breath dyspnea     Medications:  Prescriptions Prior to Admission  Medication Sig Dispense Refill Last Dose  . acetaminophen (TYLENOL) 325 MG tablet Take 2 tablets (650 mg total) by mouth every 6 (six) hours as needed for mild pain (or Fever >/= 101). 30 tablet 0 prn at prn  . albuterol (PROVENTIL HFA;VENTOLIN HFA) 108 (90 Base) MCG/ACT inhaler Inhale 1-2 puffs into the lungs every 6 (six) hours as needed for wheezing or shortness of breath. 6.7 g 0 prn at prn  . beclomethasone (QVAR) 80 MCG/ACT inhaler Inhale 2 puffs into the lungs 2 (two) times daily. 1 Inhaler 0 unknown at unknown  . budesonide (PULMICORT) 0.25 MG/2ML nebulizer solution Take 2 mLs (0.25 mg total) by nebulization 2 (two) times daily. 60 mL 12 unknown at unknown  . ibuprofen (ADVIL,MOTRIN) 200 MG tablet Take 200 mg by mouth every 6 (six) hours as needed.   prn at prn  . nicotine (NICODERM CQ - DOSED IN MG/24 HOURS) 21 mg/24hr patch Place 1 patch (21 mg total) onto the skin daily. (Patient not taking: Reported on 04/14/2017) 28 patch 0 Not Taking at Unknown time  . predniSONE (DELTASONE) 20 MG tablet Take 2 tablets (40 mg total) by mouth daily with breakfast. (Patient not  taking: Reported on 04/14/2017) 4 tablet 0 Completed Course at Unknown time    Assessment: Pharmacy consulted to dose lovenox in this 66 year old female admitted with elevated troponin.  CrCl = 56.9 ml/min, TBW = 55.3 kg   Goal of Therapy:  troponin WNL  Monitor platelets by anticoagulation protocol: Yes   Plan:  Lovenox 55 mg SQ Q12H ordered to start on 7/28 @ 19:00.   Rose Torres D 04/14/2017,6:59 PM

## 2017-04-14 NOTE — ED Notes (Signed)
Pt O2 reduced from 3L to 2L, sat 92%. Returned pt to 3L at this time.

## 2017-04-14 NOTE — H&P (Signed)
[facility]   PATIENT NAME: Rose Torres    MR#:  [id number]  DATE OF BIRTH:  02/27/1951  DATE OF ADMISSION:  04/14/2017  PRIMARY CARE PHYSICIAN: Patient, No Pcp Per   REQUESTING/REFERRING PHYSICIAN: Phineas SemenGoodman, Graydon, MD  CHIEF COMPLAINT:   Chief Complaint  Patient presents with  . Shortness of Breath   Shortness of breath for 3 days. HISTORY OF PRESENT ILLNESS:  Rose Torres  is a 66 y.o. female with a known history of Occult abuse, COPD and bronchitis. The patient presently ED with the above chief complaints. She has a history of COPD and was on home oxygen. She is off oxygen for unknown period of time. She started to have shortness breath and cough for 3 days, dyspnea on exertion, orthopnea and nocturnal dyspnea. She denies any chest pain, palpitation or leg swelling. Chest x-ray show pulmonary edema and cardiomyogly. She also complains of chronic leg and arm pain.  PAST MEDICAL HISTORY:   Past Medical History:  Diagnosis Date  . Alcohol abuse   . Bronchitis   . COPD (chronic obstructive pulmonary disease) (HCC)   . Shortness of breath dyspnea     PAST SURGICAL HISTORY:   Past Surgical History:  Procedure Laterality Date  . ABDOMINAL HYSTERECTOMY    . CYST REMOVAL HAND      SOCIAL HISTORY:   Social History  Substance Use Topics  . Smoking status: Current Every Day Smoker    Packs/day: 0.50  . Smokeless tobacco: Current User  . Alcohol use No    FAMILY HISTORY:   Family History  Problem Relation Age of Onset  . Healthy Mother   . Melanoma Father     DRUG ALLERGIES:  No Known Allergies  REVIEW OF SYSTEMS:   Review of Systems  Constitutional: Positive for malaise/fatigue. Negative for chills and fever.  HENT: Negative for sore throat.   Eyes: Negative for blurred vision and double vision.  Respiratory: Positive for cough and shortness of breath. Negative for hemoptysis, sputum production, wheezing and  stridor.   Cardiovascular: Positive for orthopnea. Negative for chest pain, palpitations and leg swelling.  Gastrointestinal: Negative for abdominal pain, blood in stool, diarrhea, melena, nausea and vomiting.  Genitourinary: Negative for dysuria, flank pain and hematuria.  Musculoskeletal: Positive for joint pain. Negative for back pain.  Skin: Negative for itching and rash.  Neurological: Positive for weakness and headaches. Negative for dizziness, sensory change, focal weakness, seizures and loss of consciousness.  Endo/Heme/Allergies: Negative for polydipsia.  Psychiatric/Behavioral: Negative for depression. The patient is not nervous/anxious.     MEDICATIONS AT HOME:   Prior to Admission medications   Medication Sig Start Date End Date Taking? Authorizing Provider  acetaminophen (TYLENOL) 325 MG tablet Take 2 tablets (650 mg total) by mouth every 6 (six) hours as needed for mild pain (or Fever >/= 101). 01/22/17  Yes Mody, Patricia PesaSital, MD  albuterol (PROVENTIL HFA;VENTOLIN HFA) 108 (90 Base) MCG/ACT inhaler Inhale 1-2 puffs into the lungs every 6 (six) hours as needed for wheezing or shortness of breath. 01/22/17 04/14/17 Yes Mody, Patricia PesaSital, MD  beclomethasone (QVAR) 80 MCG/ACT inhaler Inhale 2 puffs into the lungs 2 (two) times daily. 01/22/17  Yes Mody, Sital, MD  budesonide (PULMICORT) 0.25 MG/2ML nebulizer solution Take 2 mLs (0.25 mg total) by nebulization 2 (two) times daily. 12/07/15  Yes Enedina FinnerPatel, Sona, MD  ibuprofen (ADVIL,MOTRIN) 200 MG tablet Take 200 mg by mouth every 6 (six) hours as needed.  Yes [provider]  nicotine (NICODERM CQ - DOSED IN MG/24 HOURS) 21 mg/24hr patch Place 1 patch (21 mg total) onto the skin daily. Patient not taking: Reported on 04/14/2017 01/22/17   Adrian SaranMody, Sital, MD  predniSONE (DELTASONE) 20 MG tablet Take 2 tablets (40 mg total) by mouth daily with breakfast. Patient not taking: Reported on 04/14/2017 01/22/17   Adrian SaranMody, Sital, MD      VITAL SIGNS:  Blood  pressure 119/73, pulse (!) 114, temperature 98.1 F (36.7 C), temperature source Oral, resp. rate (!) 29, height 5\' 2"  (1.575 m), weight 122 lb (55.3 kg), SpO2 97 %.  PHYSICAL EXAMINATION:  Physical Exam  GENERAL:  66 y.o.-year-old patient lying in the bed with no acute distress.  EYES: Pupils equal, round, reactive to light and accommodation. No scleral icterus. Extraocular muscles intact.  HEENT: Head atraumatic, normocephalic. Oropharynx and nasopharynx clear.  NECK:  Supple, no jugular venous distention. No thyroid enlargement, no tenderness.  LUNGS: Normal breath sounds bilaterally, no wheezing, bilateral basilar rales, no rhonchi or crepitation. No use of accessory muscles of respiration.  CARDIOVASCULAR: S1, S2 normal. No murmurs, rubs, or gallops.  ABDOMEN: Soft, nontender, nondistended. Bowel sounds present. No organomegaly or mass.  EXTREMITIES: No pedal edema, cyanosis, or clubbing. No calf tenderness. NEUROLOGIC: Cranial nerves II through XII are intact. Muscle strength 3/5 in all extremities. Sensation intact. Gait not checked.  PSYCHIATRIC: The patient is alert and oriented x 3.  SKIN: No obvious rash, lesion, or ulcer.   LABORATORY PANEL:   CBC  Recent Labs Lab 04/14/17 1522  WBC 15.5*  HGB 15.6  HCT 47.0  PLT 322   ------------------------------------------------------------------------------------------------------------------  Chemistries   Recent Labs Lab 04/14/17 1522  NA 135  K 4.7  CL 103  CO2 25  GLUCOSE 124*  BUN 9  CREATININE 0.48  CALCIUM 8.6*  AST 24  ALT 13*  ALKPHOS 140*  BILITOT 0.5   ------------------------------------------------------------------------------------------------------------------  Cardiac Enzymes  Recent Labs Lab 04/14/17 1522  TROPONINI 0.06*   ------------------------------------------------------------------------------------------------------------------  RADIOLOGY:  Dg Chest Portable 1 View  Result  Date: 04/14/2017 CLINICAL DATA:  Patient with shortness of breath and hypoxia. EXAM: PORTABLE CHEST 1 VIEW COMPARISON:  CT chest 01/19/2017; chest radiograph 01/19/2017. FINDINGS: Monitoring leads overlie the patient. Stable cardiomegaly. Interval worsening bilateral interstitial pulmonary opacities. Trace bilateral pleural effusions. IMPRESSION: Cardiomegaly and mild interstitial edema. Electronically Signed   By: Annia Beltrew  Davis M.D.   On: 04/14/2017 15:46      IMPRESSION AND PLAN:   New-onset acute CHF, unknown type. The patient will be admitted to telemetry floor. Start CHF protocol, Lasix 20 mg IV every 12 hours, echocardiogram and cardiology consult.  Acute respiratory failure with hypoxia due to above. DuoNeb when necessary, continue oxygen by nasal cannula.  Elevated troponin, continue aspirin, start Lovenox pharmacy to dose per Dr. Welton FlakesKhan.  COPD. Stable. Continue home nebulizer. Tobacco abuse. Smoking cessation was counseled for 3-4 minutes. Nicotine patch.  Discussed with Dr. Welton FlakesKhan.  All the records are reviewed and case discussed with ED provider. Management plans discussed with the patient, family and they are in agreement.  CODE STATUS: Full code  TOTAL TIME TAKING CARE OF THIS PATIENT: 57 minutes.    Shaune Pollackhen, Jovee Dettinger M.D on 04/14/2017 at 6:02 PM  Between 7am to 6pm - Pager - [phone]  After 6pm go to [email or web address] - Social research officer, governmentpassword [email or web address] [email or web address]  [facility]  Office  [phone]  CC: Primary care physician; Patient, No Pcp Per  Note: This dictation was prepared with Dragon dictation along with smaller phrase technology. Any transcriptional errors that result from this process are unintentional.

## 2017-04-14 NOTE — ED Provider Notes (Signed)
[facility] Emergency Department Provider Note   ____________________________________________   I have reviewed the triage vital signs and the nursing notes.   HISTORY  Chief Complaint Shortness of Breath   History limited by: Not Limited   HPI Rose Torres is a 66 y.o. female who presents to the emergency department today via EMS because of concerns for shortness of breath. It appears that the shortness of breath has been getting worse over the past few days. Patient states it is worse with exertion. In addition she has been having pain and cramping in her extremities. She states that this problem has been going on since she was discharged from the hospital a couple of months ago with pneumonia. The patient has oxygen at home but states that she was weaned off of it. Her initial room air saturation was in the 80s per EMS. She received one breathing treatment with EMS.   Past Medical History:  Diagnosis Date  . Alcohol abuse   . Bronchitis   . COPD (chronic obstructive pulmonary disease) (HCC)   . Shortness of breath dyspnea     Patient Active Problem List   Diagnosis Date Noted  . Sepsis (HCC) 01/19/2017  . Gastroenteritis 01/19/2017  . COPD (chronic obstructive pulmonary disease) (HCC) 01/19/2017  . Lactic acidosis 01/19/2017  . CAP (community acquired pneumonia) 01/19/2017  . Alcohol abuse 01/19/2017  . Acute respiratory failure with hypoxia (HCC) 12/06/2015    Past Surgical History:  Procedure Laterality Date  . ABDOMINAL HYSTERECTOMY    . CYST REMOVAL HAND      Prior to Admission medications   Medication Sig Start Date End Date Taking? Authorizing Provider  acetaminophen (TYLENOL) 325 MG tablet Take 2 tablets (650 mg total) by mouth every 6 (six) hours as needed for mild pain (or Fever >/= 101). 01/22/17   Adrian SaranMody, Sital, MD  albuterol (PROVENTIL HFA;VENTOLIN HFA) 108 (90 Base) MCG/ACT inhaler Inhale 1-2 puffs into the lungs every 6  (six) hours as needed for wheezing or shortness of breath. 01/22/17 02/22/17  Adrian SaranMody, Sital, MD  beclomethasone (QVAR) 80 MCG/ACT inhaler Inhale 2 puffs into the lungs 2 (two) times daily. 01/22/17   Adrian SaranMody, Sital, MD  budesonide (PULMICORT) 0.25 MG/2ML nebulizer solution Take 2 mLs (0.25 mg total) by nebulization 2 (two) times daily. 12/07/15   Enedina FinnerPatel, Sona, MD  ibuprofen (ADVIL,MOTRIN) 200 MG tablet Take 200 mg by mouth every 6 (six) hours as needed.    [provider]  levofloxacin (LEVAQUIN) 750 MG tablet Take 1 tablet (750 mg total) by mouth daily. 01/22/17   Adrian SaranMody, Sital, MD  nicotine (NICODERM CQ - DOSED IN MG/24 HOURS) 21 mg/24hr patch Place 1 patch (21 mg total) onto the skin daily. 01/22/17   Adrian SaranMody, Sital, MD  predniSONE (DELTASONE) 20 MG tablet Take 2 tablets (40 mg total) by mouth daily with breakfast. 01/22/17   Adrian SaranMody, Sital, MD    Allergies Patient has no known allergies.  Family History  Problem Relation Age of Onset  . Healthy Mother   . Melanoma Father     Social History Social History  Substance Use Topics  . Smoking status: Current Every Day Smoker    Packs/day: 0.50  . Smokeless tobacco: Current User  . Alcohol use No    Review of Systems Constitutional: No fever/chills Eyes: No visual changes. ENT: No sore throat. Cardiovascular: Denies chest pain. Respiratory: Positive for shortness of breath. Gastrointestinal: No abdominal pain.  No nausea, no vomiting.  No diarrhea.  Genitourinary: Negative for dysuria. Musculoskeletal: Positive for extremity cramping. Skin: Negative for rash. Neurological: Negative for headaches, focal weakness or numbness.  ____________________________________________   PHYSICAL EXAM:  VITAL SIGNS: ED Triage Vitals  Enc Vitals Group     BP 04/14/17 1516 108/89     Pulse Rate 04/14/17 1516 (!) 107     Resp 04/14/17 1516 20     Temp 04/14/17 1516 98.1 F (36.7 C)     Temp Source 04/14/17 1516 Oral     SpO2 04/14/17 1516 97 %      Weight 04/14/17 1519 122 lb (55.3 kg)     Height 04/14/17 1519 5\' 2"  (1.575 m)     Head Circumference --      Peak Flow --      Pain Score 04/14/17 1514 5   Constitutional: Alert and oriented. Well appearing and in no distress. Eyes: Conjunctivae are normal.  ENT   Head: Normocephalic and atraumatic.   Nose: No congestion/rhinnorhea.   Mouth/Throat: Mucous membranes are moist.   Neck: No stridor. Hematological/Lymphatic/Immunilogical: No cervical lymphadenopathy. Cardiovascular: Tachycardia, regular rhythm.  No murmurs, rubs, or gallops.  Respiratory: Decreased breath sounds diffusely.  Gastrointestinal: Soft and non tender. No rebound. No guarding.  Genitourinary: Deferred Musculoskeletal: Normal range of motion in all extremities. No lower extremity edema. Neurologic:  Normal speech and language. No gross focal neurologic deficits are appreciated.  Skin:  Skin is warm, dry and intact. No rash noted. Psychiatric: Mood and affect are normal. Speech and behavior are normal. Patient exhibits appropriate insight and judgment.  ____________________________________________    LABS (pertinent positives/negatives)  Labs Reviewed  CBC - Abnormal; Notable for the following:       Result Value   WBC 15.5 (*)    MCV 103.0 (*)    MCH 34.1 (*)    RDW 17.2 (*)    All other components within normal limits  TROPONIN I - Abnormal; Notable for the following:    Troponin I 0.06 (*)    All other components within normal limits  COMPREHENSIVE METABOLIC PANEL - Abnormal; Notable for the following:    Glucose, Bld 124 (*)    Calcium 8.6 (*)    Total Protein 6.3 (*)    Albumin 2.8 (*)    ALT 13 (*)    Alkaline Phosphatase 140 (*)    All other components within normal limits     ____________________________________________   EKG  I, Phineas Semen, attending physician, personally viewed and interpreted this EKG  EKG Time: 1523 Rate: 116 Rhythm: sinus tachycardia Axis:  right axis deviation Intervals: qtc 517 QRS: narrow, q waves V1 ST changes: no st elevation Impression: abnormal ekg   ____________________________________________    RADIOLOGY  CXR IMPRESSION:  Cardiomegaly and mild interstitial edema.   ____________________________________________   PROCEDURES  Procedures  CRITICAL CARE Performed by: Phineas Semen   Total critical care time: 30 minutes  Critical care time was exclusive of separately billable procedures and treating other patients.  Critical care was necessary to treat or prevent imminent or life-threatening deterioration.  Critical care was time spent personally by me on the following activities: development of treatment plan with patient and/or surrogate as well as nursing, discussions with consultants, evaluation of patient's response to treatment, examination of patient, obtaining history from patient or surrogate, ordering and performing treatments and interventions, ordering and review of laboratory studies, ordering and review of radiographic studies, pulse oximetry and re-evaluation of patient's condition.  ____________________________________________   INITIAL IMPRESSION /  ASSESSMENT AND PLAN / ED COURSE  Pertinent labs & imaging results that were available during my care of the patient were reviewed by me and considered in my medical decision making (see chart for details).  Patient presented to the emergency department today because of concerns for shortness of breath. Patient was on oxygen upon arrival was kept on oxygen here. She did have somewhat diminished breath sounds diffusely. No lower extremity edema however chest x-ray was concerning for pulmonary edema. Patient denies any history of heart failure. Patient's troponin was elevated at 0.06. Patient was given aspirin. This point I do have concern Cerner that the elevation is secondary to heart failure. However possible patient had ACLS however denied  any chest pain. Will plan on admission to the hospital service.  ____________________________________________   FINAL CLINICAL IMPRESSION(S) / ED DIAGNOSES  Final diagnoses:  SOB (shortness of breath)  Elevated troponin  Acute pulmonary edema (HCC)     Note: This dictation was prepared with Dragon dictation. Any transcriptional errors that result from this process are unintentional     Phineas SemenGoodman, Jerrell Mangel, MD 04/14/17 [phone]

## 2017-04-14 NOTE — ED Triage Notes (Addendum)
Pt presents to ED via [facility] from home c/o SOB and hypoxia. Hx COPD. EMS report pt was found with O2 sat in 80s on RA. Improved to 93% on 4L. Given duoneb by EMS PTA. [facility] for pneumosepsis. Pt states she did not need oxygen at discharge and does not use it at home.

## 2017-04-14 NOTE — ED Notes (Addendum)
Pt O2 reduced from 4L to 3L, O2 sat 97%. Will continue to monitor.

## 2017-04-14 NOTE — ED Notes (Signed)
Date and time results received: 04/14/17 4:10 PM  (use smartphrase ".now" to insert current time)  Test: troponin Critical Value: 0.06  Name of Provider Notified: Dr. Derrill KayGoodman  Orders Received? Or Actions Taken?: no new orders at this time

## 2017-04-15 ENCOUNTER — Inpatient Hospital Stay
Admit: 2017-04-15 | Discharge: 2017-04-15 | Disposition: A | Discharge status: Home / Self Care | Payer: Medicaid Other | Attending: Cardiovascular Disease | Admitting: Cardiovascular Disease

## 2017-04-15 ENCOUNTER — Inpatient Hospital Stay: Payer: Medicaid Other

## 2017-04-15 DIAGNOSIS — J441 Chronic obstructive pulmonary disease with (acute) exacerbation: Secondary | ICD-10-CM

## 2017-04-15 LAB — CBC
HEMATOCRIT: 47.8 % — AB (ref 35.0–47.0)
HEMOGLOBIN: 15.8 g/dL (ref 12.0–16.0)
MCH: 34.4 pg — AB (ref 26.0–34.0)
MCHC: 33.2 g/dL (ref 32.0–36.0)
MCV: 103.8 fL — AB (ref 80.0–100.0)
Platelets: 316 10*3/uL (ref 150–440)
RBC: 4.6 MIL/uL (ref 3.80–5.20)
RDW: 17.1 % — AB (ref 11.5–14.5)
WBC: 13.6 10*3/uL — ABNORMAL HIGH (ref 3.6–11.0)

## 2017-04-15 LAB — ECHOCARDIOGRAM COMPLETE
Height: 62 in
Weight: 2024.7 oz

## 2017-04-15 LAB — BLOOD GAS, ARTERIAL
Acid-Base Excess: 6.8 mmol/L — ABNORMAL HIGH (ref 0.0–2.0)
Bicarbonate: 36.7 mmol/L — ABNORMAL HIGH (ref 20.0–28.0)
FIO2: 0.32
O2 SAT: 84.9 %
PCO2 ART: 80 mmHg — AB (ref 32.0–48.0)
Patient temperature: 37
pH, Arterial: 7.27 — ABNORMAL LOW (ref 7.350–7.450)
pO2, Arterial: 57 mmHg — ABNORMAL LOW (ref 83.0–108.0)

## 2017-04-15 LAB — MRSA PCR SCREENING: MRSA BY PCR: NEGATIVE

## 2017-04-15 LAB — BASIC METABOLIC PANEL
ANION GAP: 7 (ref 5–15)
BUN: 9 mg/dL (ref 6–20)
CHLORIDE: 100 mmol/L — AB (ref 101–111)
CO2: 32 mmol/L (ref 22–32)
Calcium: 8.6 mg/dL — ABNORMAL LOW (ref 8.9–10.3)
Creatinine, Ser: 0.6 mg/dL (ref 0.44–1.00)
GFR calc Af Amer: >60 mL/min (ref >60)
GLUCOSE: 120 mg/dL — AB (ref 65–99)
POTASSIUM: 4 mmol/L (ref 3.5–5.1)
Sodium: 139 mmol/L (ref 135–145)

## 2017-04-15 LAB — TROPONIN I: TROPONIN I: 0.04 ng/mL — AB (ref <0.03)

## 2017-04-15 LAB — MAGNESIUM: Magnesium: 1.6 mg/dL — ABNORMAL LOW (ref 1.7–2.4)

## 2017-04-15 LAB — GLUCOSE, CAPILLARY: Glucose-Capillary: 147 mg/dL — ABNORMAL HIGH (ref 65–99)

## 2017-04-15 MED ORDER — ALBUTEROL SULFATE (2.5 MG/3ML) 0.083% IN NEBU
2.5000 mg | INHALATION_SOLUTION | RESPIRATORY_TRACT | Status: DC
  Administered 2017-04-15 – 2017-04-16 (×7): 2.5 mg via RESPIRATORY_TRACT
  Filled 2017-04-15 (×6): qty 3

## 2017-04-15 MED ORDER — AMIODARONE HCL IN DEXTROSE 360-4.14 MG/200ML-% IV SOLN
60.0000 mg/h | INTRAVENOUS | Status: AC
  Administered 2017-04-15: 60 mg/h via INTRAVENOUS
  Filled 2017-04-15 (×2): qty 200

## 2017-04-15 MED ORDER — VITAMIN B-1 100 MG PO TABS
100.0000 mg | ORAL_TABLET | Freq: Every day | ORAL | Status: DC
  Administered 2017-04-15 – 2017-04-18 (×4): 100 mg via ORAL
  Filled 2017-04-15 (×4): qty 1

## 2017-04-15 MED ORDER — MAGNESIUM SULFATE IN D5W 1-5 GM/100ML-% IV SOLN
1.0000 g | Freq: Once | INTRAVENOUS | Status: AC
  Administered 2017-04-15: 1 g via INTRAVENOUS
  Filled 2017-04-15: qty 100

## 2017-04-15 MED ORDER — METHYLPREDNISOLONE SODIUM SUCC 125 MG IJ SOLR
60.0000 mg | Freq: Four times a day (QID) | INTRAMUSCULAR | Status: DC
  Administered 2017-04-15 – 2017-04-16 (×5): 60 mg via INTRAVENOUS
  Filled 2017-04-15 (×5): qty 2

## 2017-04-15 MED ORDER — FOLIC ACID 1 MG PO TABS
1.0000 mg | ORAL_TABLET | Freq: Every day | ORAL | Status: DC
  Administered 2017-04-15 – 2017-04-18 (×4): 1 mg via ORAL
  Filled 2017-04-15 (×4): qty 1

## 2017-04-15 MED ORDER — ENOXAPARIN SODIUM 40 MG/0.4ML ~~LOC~~ SOLN
40.0000 mg | SUBCUTANEOUS | Status: DC
  Administered 2017-04-16 – 2017-04-18 (×3): 40 mg via SUBCUTANEOUS
  Filled 2017-04-15 (×3): qty 0.4

## 2017-04-15 MED ORDER — NICOTINE 21 MG/24HR TD PT24: 21.0000 mg | MEDICATED_PATCH | Freq: Every day | TRANSDERMAL | Status: DC

## 2017-04-15 MED ORDER — AMIODARONE HCL IN DEXTROSE 360-4.14 MG/200ML-% IV SOLN
30.0000 mg/h | INTRAVENOUS | Status: DC
  Administered 2017-04-15: 30 mg/h via INTRAVENOUS
  Filled 2017-04-15: qty 200

## 2017-04-15 MED ORDER — VITAMIN B-12 1000 MCG PO TABS
1000.0000 ug | ORAL_TABLET | Freq: Every day | ORAL | Status: DC
  Administered 2017-04-15 – 2017-04-18 (×4): 1000 ug via ORAL
  Filled 2017-04-15 (×4): qty 1

## 2017-04-15 MED ORDER — AMIODARONE LOAD VIA INFUSION
150.0000 mg | Freq: Once | INTRAVENOUS | Status: AC
  Administered 2017-04-15: 150 mg via INTRAVENOUS
  Filled 2017-04-15: qty 83.34

## 2017-04-15 MED ORDER — NICOTINE 21 MG/24HR TD PT24
21.0000 mg | MEDICATED_PATCH | Freq: Every day | TRANSDERMAL | Status: DC
  Administered 2017-04-15 – 2017-04-18 (×4): 21 mg via TRANSDERMAL
  Filled 2017-04-15 (×4): qty 1

## 2017-04-15 MED ORDER — LEVOFLOXACIN IN D5W 750 MG/150ML IV SOLN
750.0000 mg | INTRAVENOUS | Status: DC
  Administered 2017-04-15: 750 mg via INTRAVENOUS
  Filled 2017-04-15 (×2): qty 150

## 2017-04-15 MED ORDER — CARVEDILOL 6.25 MG PO TABS
6.2500 mg | ORAL_TABLET | Freq: Two times a day (BID) | ORAL | Status: DC
Start: 2017-04-15 — End: 2017-04-16
  Filled 2017-04-15 (×3): qty 1

## 2017-04-15 MED ORDER — MORPHINE SULFATE (PF) 2 MG/ML IV SOLN
2.0000 mg | INTRAVENOUS | Status: DC | PRN
  Administered 2017-04-15 – 2017-04-16 (×2): 2 mg via INTRAVENOUS
  Filled 2017-04-15 (×2): qty 1

## 2017-04-15 NOTE — Progress Notes (Signed)
Patients tidal volumes dropped to 250-24889ml, increased to 12/6 and tidal volumes increased to 350 -400.

## 2017-04-15 NOTE — Progress Notes (Signed)
*  PRELIMINARY RESULTS* Echocardiogram 2D Echocardiogram has been performed.  Garrel Ridgelikeshia S Demone Lyles 04/15/2017, 1:00 PM

## 2017-04-15 NOTE — Consult Note (Addendum)
[facility] [place] [facility] Consultation      Assessment and Plan:  Acute hypoxic and hypercapnic respiratory failure due to AECOPD, with possible pneumonia.  -Continue BiPAP, nebulizers, IV steroids, will start antibiotics. -Wean down/off BiPAP as tolerated.  Nicotine abuse.  -Discussed importance of nicotine cessation.  Imaging personally reviewed, there is small left basal pleural effusion.   Congestive heart failure with elevated troponin. -Echocardiogram pending.   Date: 04/15/2017  MRN# [id number] Rose Torres 05/29/1951  Referring Physician: Dr. Tildon HuskyModi for dyspnea.   Rose Torres is a 66 y.o. old female seen in consultation for chief complaint of:    Chief Complaint  Patient presents with  . Shortness of Breath    HPI:  The patient is a 66 year old female smoker with a history of emphysema, on home oxygen. Patient is currently on BiPAP, cannot provide a history, therefore, all history is obtained from chart, staff. He patient presented with shortness of breath and cough for 3 days with progressive dyspnea on exertion and progressive orthopnea. She was admitted to the hospital yesterday. This morning it was discovered that she was increasingly dyspneic, and the patient had to be started on BiPAP, therefore she was transferred to the ICU for further monitoring and treatment. Currently she is on BiPAP, she feels that her breathing has improved.    PMHX:   Past Medical History:  Diagnosis Date  . Alcohol abuse   . Bronchitis   . COPD (chronic obstructive pulmonary disease) (HCC)   . Shortness of breath dyspnea    Surgical Hx:  Past Surgical History:  Procedure Laterality Date  . ABDOMINAL HYSTERECTOMY    . CYST REMOVAL HAND     Family Hx:  Family History  Problem Relation Age of Onset  . Healthy Mother   . Melanoma Father    Social Hx:   Social History  Substance Use Topics  . Smoking status: Current Every Day Smoker    Packs/day:  0.50  . Smokeless tobacco: Current User  . Alcohol use No   Medication:    Current Facility-Administered Medications:  .  0.9 %  sodium chloride infusion, 250 mL, Intravenous, PRN, Shaune Pollackhen, Qing, MD .  acetaminophen (TYLENOL) tablet 650 mg, 650 mg, Oral, Q6H PRN, 650 mg at 04/15/17 0844 **OR** acetaminophen (TYLENOL) suppository 650 mg, 650 mg, Rectal, Q6H PRN, Shaune Pollackhen, Qing, MD .  albuterol (PROVENTIL) (2.5 MG/3ML) 0.083% nebulizer solution 2.5 mg, 2.5 mg, Nebulization, Q4H, Kalisetti, Radhika, MD, 2.5 mg at 04/15/17 0820 .  aspirin EC tablet 81 mg, 81 mg, Oral, Daily, Shaune Pollackhen, Qing, MD .  bisacodyl (DULCOLAX) EC tablet 5 mg, 5 mg, Oral, Daily PRN, Shaune Pollackhen, Qing, MD .  budesonide (PULMICORT) nebulizer solution 0.25 mg, 0.25 mg, Nebulization, BID, Shaune Pollackhen, Qing, MD, 0.25 mg at 04/15/17 0757 .  carvedilol (COREG) tablet 6.25 mg, 6.25 mg, Oral, BID WC, Enid BaasKalisetti, Radhika, MD, Stopped at 04/15/17 0800 .  [START ON 04/16/2017] enoxaparin (LOVENOX) injection 40 mg, 40 mg, Subcutaneous, Q24H, Kalisetti, Radhika, MD .  furosemide (LASIX) injection 20 mg, 20 mg, Intravenous, Q12H, Shaune Pollackhen, Qing, MD, 20 mg at 04/15/17 [phone]  HYDROcodone-acetaminophen (NORCO/VICODIN) 5-325 MG per tablet 1-2 tablet, 1-2 tablet, Oral, Q4H PRN, Shaune Pollackhen, Qing, MD .  lisinopril (PRINIVIL,ZESTRIL) tablet 2.5 mg, 2.5 mg, Oral, Daily, Shaune Pollackhen, Qing, MD, 2.5 mg at 04/14/17 2127 .  methylPREDNISolone sodium succinate (SOLU-MEDROL) 125 mg/2 mL injection 60 mg, 60 mg, Intravenous, Q6H, Enid BaasKalisetti, Radhika, MD, 60 mg at 04/15/17 [id number] .  ondansetron (ZOFRAN) tablet 4  mg, 4 mg, Oral, Q6H PRN **OR** ondansetron (ZOFRAN) injection 4 mg, 4 mg, Intravenous, Q6H PRN, Shaune Pollackhen, Qing, MD .  pneumococcal 23 valent vaccine (PNU-IMMUNE) injection 0.5 mL, 0.5 mL, Intramuscular, Tomorrow-1000, Shaune Pollackhen, Qing, MD .  senna-docusate (Senokot-S) tablet 1 tablet, 1 tablet, Oral, QHS PRN, Shaune Pollackhen, Qing, MD .  sodium chloride flush (NS) 0.9 % injection 3 mL, 3 mL, Intravenous, Q12H, Shaune Pollackhen,  Qing, MD, 3 mL at 04/14/17 2130 .  sodium chloride flush (NS) 0.9 % injection 3 mL, 3 mL, Intravenous, PRN, Shaune Pollackhen, Qing, MD   Allergies:  Patient has no known allergies.  Review of Systems: Could not obtain due to BiPAP.  Physical Examination:   VS: BP (!) 88/60   Pulse (!) 114   Temp 100 F (37.8 C) (Axillary)   Resp (!) 30   Ht 5\' 2"  (1.575 m)   Wt 126 lb 8.7 oz (57.4 kg)   SpO2 96%   BMI 23.15 kg/m   General Appearance: No distress  Neuro:without focal findings,  speech normal,  HEENT: PERRLA, EOM intact.   Pulmonary: normal breath sounds, bilateral scattered wheezing. CardiovascularNormal S1,S2.  No m/r/g.   Abdomen: Benign, Soft, non-tender. Renal:  No costovertebral tenderness  GU:  No performed at this time. Endoc: No evident thyromegaly, no signs of acromegaly. Skin:   warm, no rashes, no ecchymosis  Extremities: normal, no cyanosis, clubbing.  Other findings:    LABORATORY PANEL:   CBC  Recent Labs Lab 04/15/17 0038  WBC 13.6*  HGB 15.8  HCT 47.8*  PLT 316   ------------------------------------------------------------------------------------------------------------------  Chemistries   Recent Labs Lab 04/14/17 1522 04/15/17 0038  NA 135 139  K 4.7 4.0  CL 103 100*  CO2 25 32  GLUCOSE 124* 120*  BUN 9 9  CREATININE 0.48 0.60  CALCIUM 8.6* 8.6*  MG  --  1.6*  AST 24  --   ALT 13*  --   ALKPHOS 140*  --   BILITOT 0.5  --    ------------------------------------------------------------------------------------------------------------------  Cardiac Enzymes  Recent Labs Lab 04/15/17 0038  TROPONINI 0.04*   ------------------------------------------------------------  RADIOLOGY:  Dg Chest 1 View  Result Date: 04/15/2017 CLINICAL DATA:  Shortness of Breath EXAM: CHEST 1 VIEW COMPARISON:  04/14/2017 FINDINGS: Cardiomegaly with vascular congestion and interstitial prominence, likely interstitial edema. Small left pleural effusion, new  since prior study. No right effusion. IMPRESSION: Stable or slightly increased interstitial edema pattern. Small left effusion. Electronically Signed   By: Charlett NoseKevin  Dover M.D.   On: 04/15/2017 08:43   Dg Chest Portable 1 View  Result Date: 04/14/2017 CLINICAL DATA:  Patient with shortness of breath and hypoxia. EXAM: PORTABLE CHEST 1 VIEW COMPARISON:  CT chest 01/19/2017; chest radiograph 01/19/2017. FINDINGS: Monitoring leads overlie the patient. Stable cardiomegaly. Interval worsening bilateral interstitial pulmonary opacities. Trace bilateral pleural effusions. IMPRESSION: Cardiomegaly and mild interstitial edema. Electronically Signed   By: Annia Beltrew  Davis M.D.   On: 04/14/2017 15:46       Thank  you for the consultation and for allowing [facility] to assist in the care of your patient. Our recommendations are noted above.  Please contact us if we can be of further service.   Wells Guileseep Marisha Renier, MD.  Board Certified in Internal Medicine, Pulmonary Medicine, Critical Care Medicine, and Sleep Medicine.  [facility] Office Number: [phone]  Santiago Gladavid Kasa, M.D.  Billy Fischeravid Simonds, M.D  04/15/2017

## 2017-04-15 NOTE — Progress Notes (Signed)
Pneumonia Vaccine held due to patient unable to consent at this time.

## 2017-04-15 NOTE — Progress Notes (Signed)
MEDICATION RELATED CONSULT NOTE - INITIAL   Pharmacy Consult for amiodarone DDI  No Known Allergies  Patient Measurements: Height: 5\' 2"  (157.5 cm) Weight: 126 lb 8.7 oz (57.4 kg) IBW/kg (Calculated) : 50.1  Vital Signs: Temp: 100 F (37.8 C) (07/29 0900) Temp Source: Axillary (07/29 0900) BP: 75/53 (07/29 1100) Pulse Rate: 96 (07/29 1114) Intake/Output from previous day: 07/28 0701 - 07/29 0700 In: -  Out: 1100 [Urine:1100] Intake/Output from this shift: No intake/output data recorded.  Labs:  Recent Labs  04/14/17 1522 04/15/17 0038  WBC 15.5* 13.6*  HGB 15.6 15.8  HCT 47.0 47.8*  PLT 322 316  CREATININE 0.48 0.60  MG  --  1.6*  ALBUMIN 2.8*  --   PROT 6.3*  --   AST 24  --   ALT 13*  --   ALKPHOS 140*  --   BILITOT 0.5  --    Estimated Creatinine Clearance: 56.9 mL/min (by C-G formula based on SCr of 0.6 mg/dL).  Medications:  Prescriptions Prior to Admission  Medication Sig Dispense Refill Last Dose  . acetaminophen (TYLENOL) 325 MG tablet Take 2 tablets (650 mg total) by mouth every 6 (six) hours as needed for mild pain (or Fever >/= 101). 30 tablet 0 prn at prn  . albuterol (PROVENTIL HFA;VENTOLIN HFA) 108 (90 Base) MCG/ACT inhaler Inhale 1-2 puffs into the lungs every 6 (six) hours as needed for wheezing or shortness of breath. 6.7 g 0 prn at prn  . beclomethasone (QVAR) 80 MCG/ACT inhaler Inhale 2 puffs into the lungs 2 (two) times daily. 1 Inhaler 0 unknown at unknown  . budesonide (PULMICORT) 0.25 MG/2ML nebulizer solution Take 2 mLs (0.25 mg total) by nebulization 2 (two) times daily. 60 mL 12 unknown at unknown  . ibuprofen (ADVIL,MOTRIN) 200 MG tablet Take 200 mg by mouth every 6 (six) hours as needed.   prn at prn  . nicotine (NICODERM CQ - DOSED IN MG/24 HOURS) 21 mg/24hr patch Place 1 patch (21 mg total) onto the skin daily. (Patient not taking: Reported on 04/14/2017) 28 patch 0 Not Taking at Unknown time  . predniSONE (DELTASONE) 20 MG tablet  Take 2 tablets (40 mg total) by mouth daily with breakfast. (Patient not taking: Reported on 04/14/2017) 4 tablet 0 Completed Course at Unknown time   Scheduled:  . albuterol  2.5 mg Nebulization Q4H  . aspirin EC  81 mg Oral Daily  . budesonide (PULMICORT) nebulizer solution  0.25 mg Nebulization BID  . carvedilol  6.25 mg Oral BID WC  . [START ON 04/16/2017] enoxaparin (LOVENOX) injection  40 mg Subcutaneous Q24H  . furosemide  20 mg Intravenous Q12H  . lisinopril  2.5 mg Oral Daily  . methylPREDNISolone (SOLU-MEDROL) injection  60 mg Intravenous Q6H  . pneumococcal 23 valent vaccine  0.5 mL Intramuscular Tomorrow-1000  . sodium chloride flush  3 mL Intravenous Q12H   Infusions:  . sodium chloride    . amiodarone 60 mg/hr (04/15/17 1336)   Followed by  . amiodarone    . levofloxacin (LEVAQUIN) IV 750 mg (04/15/17 1237)   PRN: sodium chloride, acetaminophen **OR** acetaminophen, bisacodyl, HYDROcodone-acetaminophen, ondansetron **OR** ondansetron (ZOFRAN) IV, senna-docusate, sodium chloride flush  Assessment: Pharmacy consulted to check drug interactions with amiodarone.  Plan:  Amiodarone combined with levofloxacin and ondansetron can cause QTc prolongation. Recommend closely monitoring EKG and QTc and changing antimicrobial therapy if QTc is elevated.  Cindi CarbonMary M Delanee Xin, PharmD Clinical Pharmacist 04/15/2017,2:19 PM

## 2017-04-15 NOTE — Progress Notes (Signed)
[facility] [place] [facility]   PATIENT NAME: Rose Torres    MR#:  [id number]  DATE OF BIRTH:  08/25/1951  SUBJECTIVE:  CHIEF COMPLAINT:   Chief Complaint  Patient presents with  . Shortness of Breath   -admitted with dyspnea and noted to have CHF exacerbation. However after years to be very tachypneic this morning with abdominal breathing. -Able to speak but not able to finish her sentences. Some confusion noted as well. -Not on home oxygen but requiring 3-4 L oxygen here  REVIEW OF SYSTEMS:  Review of Systems  Constitutional: Negative for chills, fever and malaise/fatigue.  HENT: Negative for congestion, ear discharge, ear pain, hearing loss and nosebleeds.   Eyes: Negative for blurred vision.  Respiratory: Positive for shortness of breath. Negative for cough and wheezing.   Cardiovascular: Negative for chest pain, palpitations and leg swelling.  Gastrointestinal: Negative for abdominal pain, constipation, diarrhea, nausea and vomiting.  Genitourinary: Negative for dysuria.  Musculoskeletal: Negative for myalgias.  Neurological: Negative for dizziness, speech change, focal weakness, seizures and headaches.  Psychiatric/Behavioral:       Confusion    DRUG ALLERGIES:  No Known Allergies  VITALS:  Blood pressure 100/68, pulse (!) 113, temperature 98.3 F (36.8 C), temperature source Oral, resp. rate 18, height 5\' 2"  (1.575 m), weight 55 kg (121 lb 3.2 oz), SpO2 92 %.  PHYSICAL EXAMINATION:  Physical Exam  GENERAL:  66 y.o.-year-old thin built and ill-nourished patient lying in the bed, appears to be tachypneic.  EYES: Pupils equal, round, reactive to light and accommodation. No scleral icterus. Extraocular muscles intact.  HEENT: Head atraumatic, normocephalic. Oropharynx and nasopharynx clear.  NECK:  Supple, no jugular venous distention. No thyroid enlargement, no tenderness.  LUNGS: very tight on auscultation with scattered wheezes. Using  accessory muscles to breathe with minimal exertion. No rales or rhonchi heard.  CARDIOVASCULAR: S1, S2 normal. No murmurs, rubs, or gallops.  ABDOMEN: Soft, nontender, nondistended. Bowel sounds present. No organomegaly or mass.  EXTREMITIES: No pedal edema, cyanosis, or clubbing.  NEUROLOGIC: Cranial nerves II through XII are intact. Muscle strength 5/5 in all extremities. Sensation intact. Gait not checked. Following commands PSYCHIATRIC: The patient is alert and oriented x 2-3, intermittent confusion noted  SKIN: No obvious rash, lesion, or ulcer.    LABORATORY PANEL:   CBC  Recent Labs Lab 04/15/17 0038  WBC 13.6*  HGB 15.8  HCT 47.8*  PLT 316   ------------------------------------------------------------------------------------------------------------------  Chemistries   Recent Labs Lab 04/14/17 1522 04/15/17 0038  NA 135 139  K 4.7 4.0  CL 103 100*  CO2 25 32  GLUCOSE 124* 120*  BUN 9 9  CREATININE 0.48 0.60  CALCIUM 8.6* 8.6*  MG  --  1.6*  AST 24  --   ALT 13*  --   ALKPHOS 140*  --   BILITOT 0.5  --    ------------------------------------------------------------------------------------------------------------------  Cardiac Enzymes  Recent Labs Lab 04/15/17 0038  TROPONINI 0.04*   ------------------------------------------------------------------------------------------------------------------  RADIOLOGY:  Dg Chest Portable 1 View  Result Date: 04/14/2017 CLINICAL DATA:  Patient with shortness of breath and hypoxia. EXAM: PORTABLE CHEST 1 VIEW COMPARISON:  CT chest 01/19/2017; chest radiograph 01/19/2017. FINDINGS: Monitoring leads overlie the patient. Stable cardiomegaly. Interval worsening bilateral interstitial pulmonary opacities. Trace bilateral pleural effusions. IMPRESSION: Cardiomegaly and mild interstitial edema. Electronically Signed   By: Annia Beltrew  Davis M.D.   On: 04/14/2017 15:46    EKG:   Orders placed or performed during the  hospital  encounter of 04/14/17  . ED EKG  . ED EKG    ASSESSMENT AND PLAN:   66 year old female with past medical history significant for alcohol abuse, COPD not currently on home oxygen admitted with shortness of breath  #1 Acute hypoxic respiratory failure- initially admitted for CHF exacerbation - appears much worse today- stat ABG ordered and on pulmicort nebs- change albuterol nebs to scheduled - also has COPD exacerbation - added IV steroids, ABG stat and pulmonary consult - based on ABG- might have to transfer to ICU for either Bipap or Vent - continue o2 support- on 3L o2 now - not on home o2 recently- but was in the past, not completely coherent to give history  #2 CHF- acute, unknown EF - 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 Management plans discussed with the patient, family and they are in agreement.  CODE STATUS: full code  TOTAL critical care TIME spent in TAKING CARE OF THIS PATIENT: 42 minutes.   POSSIBLE D/C IN 2-3 DAYS, DEPENDING ON CLINICAL CONDITION.   Enid BaasKALISETTI,Chasitty Hehl M.D on 04/15/2017 at 8:18 AM  Between 7am to 6pm - Pager - [phone]  After 6pm go to [email or web address] - password [email or web address]  [facility] [place] [facility]  Office  [phone]  CC: Primary care physician; Patient, No Pcp Per

## 2017-04-15 NOTE — Progress Notes (Signed)
SUBJECTIVE: Patient went into respiratory distress and sinus tachycardia and was admitted to ICU.   Vitals:   04/15/17 1000 04/15/17 1100 04/15/17 1113 04/15/17 1114  BP: (!) 70/51 (!) 75/53    Pulse: (!) 101 96  96  Resp: (!) 21 20  (!) 24  Temp:      TempSrc:      SpO2: 95% 94% 95% 95%  Weight:      Height:        Intake/Output Summary (Last 24 hours) at 04/15/17 1320 Last data filed at 04/15/17 0640  Gross per 24 hour  Intake                0 ml  Output             1100 ml  Net            -1100 ml    LABS: Basic Metabolic Panel:  Recent Labs  95/62/1307/28/18 1522 04/15/17 0038  NA 135 139  K 4.7 4.0  CL 103 100*  CO2 25 32  GLUCOSE 124* 120*  BUN 9 9  CREATININE 0.48 0.60  CALCIUM 8.6* 8.6*  MG  --  1.6*   Liver Function Tests:  Recent Labs  04/14/17 1522  AST 24  ALT 13*  ALKPHOS 140*  BILITOT 0.5  PROT 6.3*  ALBUMIN 2.8*   No results for input(s): LIPASE, AMYLASE in the last 72 hours. CBC:  Recent Labs  04/14/17 1522 04/15/17 0038  WBC 15.5* 13.6*  HGB 15.6 15.8  HCT 47.0 47.8*  MCV 103.0* 103.8*  PLT 322 316   Cardiac Enzymes:  Recent Labs  04/14/17 1522 04/14/17 1931 04/15/17 0038  TROPONINI 0.06* 0.04* 0.04*   BNP: Invalid input(s): POCBNP D-Dimer: No results for input(s): DDIMER in the last 72 hours. Hemoglobin A1C: No results for input(s): HGBA1C in the last 72 hours. Fasting Lipid Panel: No results for input(s): CHOL, HDL, LDLCALC, TRIG, CHOLHDL, LDLDIRECT in the last 72 hours. Thyroid Function Tests: No results for input(s): TSH, T4TOTAL, T3FREE, THYROIDAB in the last 72 hours.  Invalid input(s): FREET3 Anemia Panel: No results for input(s): VITAMINB12, FOLATE, FERRITIN, TIBC, IRON, RETICCTPCT in the last 72 hours.   PHYSICAL EXAM General: Well developed, well nourished, in no acute distress HEENT:  Normocephalic and atramatic Neck:  No JVD.  Lungs: Clear bilaterally to auscultation and percussion. Heart: HRRR .  Normal S1 and S2 without gallops or murmurs.  Abdomen: Bowel sounds are positive, abdomen soft and non-tender  Msk:  Back normal, normal gait. Normal strength and tone for age. Extremities: No clubbing, cyanosis or edema.   Neuro: Alert and oriented X 3. Psych:  Good affect, responds appropriately  TELEMETRY:Sinus tachycardia  ASSESSMENT AND PLAN: Congestive heart failure with respiratory failure and hypoxia due to acute diastolic dysfunction with left ventricular ejection fraction being normal and hyperdynamic LV and normal wall motion. Patient has mildly elevated troponin due to demand ischemia. Advise IV Lasix.  Active Problems:   Acute CHF (congestive heart failure) (HCC)    KHAN,SHAUKAT A, MD, [place] 04/15/2017 1:20 PM

## 2017-04-15 NOTE — Progress Notes (Signed)
Rounded with Md at bedside, patient tachypneic with abdominal breathing, lethargic, easily arousable, blood gaz order and result abnormal, patient to be transfer to ICU per bipap. Report given to charge nurse. Patieent was transfer to ICU art this time

## 2017-04-15 NOTE — Progress Notes (Signed)
Assisted with patient transfer to ICU room 7. Pt was transported on the V60 bipap with no complications noted.

## 2017-04-15 NOTE — Discharge Instructions (Signed)
Heart Failure Clinic appointment on April 25 2017 at 9:00am with Rose Kindredina Lenzy Kerschner, FNP. Please call [phone] to reschedule.

## 2017-04-16 ENCOUNTER — Telehealth: Payer: Self-pay | Admitting: *Deleted

## 2017-04-16 DIAGNOSIS — J9621 Acute and chronic respiratory failure with hypoxia: Secondary | ICD-10-CM

## 2017-04-16 DIAGNOSIS — F17200 Nicotine dependence, unspecified, uncomplicated: Secondary | ICD-10-CM

## 2017-04-16 DIAGNOSIS — J9622 Acute and chronic respiratory failure with hypercapnia: Secondary | ICD-10-CM

## 2017-04-16 LAB — CBC
HEMATOCRIT: 44.5 % (ref 35.0–47.0)
HEMOGLOBIN: 14.9 g/dL (ref 12.0–16.0)
MCH: 34.6 pg — ABNORMAL HIGH (ref 26.0–34.0)
MCHC: 33.6 g/dL (ref 32.0–36.0)
MCV: 103.1 fL — ABNORMAL HIGH (ref 80.0–100.0)
Platelets: 331 10*3/uL (ref 150–440)
RBC: 4.32 MIL/uL (ref 3.80–5.20)
RDW: 17.1 % — ABNORMAL HIGH (ref 11.5–14.5)
WBC: 8.7 10*3/uL (ref 3.6–11.0)

## 2017-04-16 LAB — BASIC METABOLIC PANEL
Anion gap: 7 (ref 5–15)
BUN: 16 mg/dL (ref 6–20)
CALCIUM: 8.8 mg/dL — AB (ref 8.9–10.3)
CHLORIDE: 95 mmol/L — AB (ref 101–111)
CO2: 32 mmol/L (ref 22–32)
CREATININE: 0.7 mg/dL (ref 0.44–1.00)
Glucose, Bld: 303 mg/dL — ABNORMAL HIGH (ref 65–99)
Potassium: 3.8 mmol/L (ref 3.5–5.1)
SODIUM: 134 mmol/L — AB (ref 135–145)

## 2017-04-16 LAB — GLUCOSE, CAPILLARY
GLUCOSE-CAPILLARY: 266 mg/dL — AB (ref 65–99)
GLUCOSE-CAPILLARY: 102 mg/dL — AB (ref 65–99)

## 2017-04-16 LAB — PHOSPHORUS: PHOSPHORUS: 1.6 mg/dL — AB (ref 2.5–4.6)

## 2017-04-16 LAB — MAGNESIUM: MAGNESIUM: 2.4 mg/dL (ref 1.7–2.4)

## 2017-04-16 LAB — HIV ANTIBODY (ROUTINE TESTING W REFLEX): HIV SCREEN 4TH GENERATION: NONREACTIVE

## 2017-04-16 MED ORDER — METHYLPREDNISOLONE SODIUM SUCC 125 MG IJ SOLR
60.0000 mg | Freq: Three times a day (TID) | INTRAMUSCULAR | Status: DC
  Administered 2017-04-16: 60 mg via INTRAVENOUS
  Filled 2017-04-16: qty 2

## 2017-04-16 MED ORDER — METHYLPREDNISOLONE SODIUM SUCC 125 MG IJ SOLR: 60.0000 mg | INTRAMUSCULAR | Status: DC

## 2017-04-16 MED ORDER — SODIUM CHLORIDE 0.9 % IV BOLUS (SEPSIS)
500.0000 mL | Freq: Once | INTRAVENOUS | Status: AC
  Administered 2017-04-16: 500 mL via INTRAVENOUS

## 2017-04-16 MED ORDER — UMECLIDINIUM-VILANTEROL 62.5-25 MCG/INH IN AEPB
1.0000 | INHALATION_SPRAY | Freq: Every day | RESPIRATORY_TRACT | Status: DC
  Administered 2017-04-16 – 2017-04-18 (×3): 1 via RESPIRATORY_TRACT
  Filled 2017-04-16: qty 14

## 2017-04-16 MED ORDER — INSULIN ASPART 100 UNIT/ML ~~LOC~~ SOLN
0.0000 [IU] | Freq: Three times a day (TID) | SUBCUTANEOUS | Status: DC
  Administered 2017-04-16: 8 [IU] via SUBCUTANEOUS
  Administered 2017-04-17: 2 [IU] via SUBCUTANEOUS
  Administered 2017-04-17 – 2017-04-18 (×2): 8 [IU] via SUBCUTANEOUS
  Filled 2017-04-16 (×4): qty 1

## 2017-04-16 MED ORDER — DOXYCYCLINE HYCLATE 100 MG PO TABS
100.0000 mg | ORAL_TABLET | Freq: Two times a day (BID) | ORAL | Status: DC
  Administered 2017-04-17 – 2017-04-18 (×3): 100 mg via ORAL
  Filled 2017-04-16 (×3): qty 1

## 2017-04-16 MED ORDER — METHYLPREDNISOLONE SODIUM SUCC 40 MG IJ SOLR
40.0000 mg | Freq: Two times a day (BID) | INTRAMUSCULAR | Status: DC
  Administered 2017-04-17 – 2017-04-18 (×3): 40 mg via INTRAVENOUS
  Filled 2017-04-16 (×3): qty 1

## 2017-04-16 MED ORDER — AMIODARONE HCL 200 MG PO TABS
400.0000 mg | ORAL_TABLET | Freq: Two times a day (BID) | ORAL | Status: DC
  Administered 2017-04-16: 400 mg via ORAL
  Filled 2017-04-16 (×2): qty 2

## 2017-04-16 MED ORDER — MORPHINE SULFATE (PF) 2 MG/ML IV SOLN
2.0000 mg | INTRAVENOUS | Status: DC | PRN
  Administered 2017-04-18: 2 mg via INTRAVENOUS

## 2017-04-16 MED ORDER — MORPHINE SULFATE (PF) 2 MG/ML IV SOLN: 2.0000 mg | INTRAVENOUS | Status: DC | PRN

## 2017-04-16 MED ORDER — IPRATROPIUM-ALBUTEROL 0.5-2.5 (3) MG/3ML IN SOLN: 3.0000 mL | RESPIRATORY_TRACT | Status: DC | PRN

## 2017-04-16 MED ORDER — INSULIN ASPART 100 UNIT/ML ~~LOC~~ SOLN: 0.0000 [IU] | Freq: Every day | SUBCUTANEOUS | Status: DC

## 2017-04-16 MED ORDER — ADULT MULTIVITAMIN W/MINERALS CH
1.0000 | ORAL_TABLET | Freq: Every day | ORAL | Status: DC
  Administered 2017-04-16 – 2017-04-18 (×3): 1 via ORAL
  Filled 2017-04-16 (×3): qty 1

## 2017-04-16 MED ORDER — K PHOS MONO-SOD PHOS DI & MONO 155-852-130 MG PO TABS
500.0000 mg | ORAL_TABLET | ORAL | Status: AC
  Administered 2017-04-16 – 2017-04-17 (×3): 500 mg via ORAL
  Filled 2017-04-16 (×5): qty 2

## 2017-04-16 MED ORDER — TRAMADOL HCL 50 MG PO TABS: 50.0000 mg | ORAL_TABLET | Freq: Four times a day (QID) | ORAL | Status: DC | PRN

## 2017-04-16 MED ORDER — LEVOFLOXACIN 500 MG PO TABS
500.0000 mg | ORAL_TABLET | Freq: Every day | ORAL | Status: DC
  Administered 2017-04-16: 500 mg via ORAL
  Filled 2017-04-16: qty 1

## 2017-04-16 MED ORDER — CARVEDILOL 3.125 MG PO TABS
3.1250 mg | ORAL_TABLET | Freq: Two times a day (BID) | ORAL | Status: DC
  Filled 2017-04-16: qty 1

## 2017-04-16 MED ORDER — ENSURE ENLIVE PO LIQD
237.0000 mL | Freq: Two times a day (BID) | ORAL | Status: DC
  Administered 2017-04-16 – 2017-04-18 (×3): 237 mL via ORAL

## 2017-04-16 NOTE — Telephone Encounter (Signed)
[facility] and spoke with Evone and informed her that pt had been set up for f/u appt with DS in office and gave appt date and time. Nothing further needed at this time.

## 2017-04-16 NOTE — Progress Notes (Signed)
MEDICATION RELATED CONSULT NOTE - Follow up  Pharmacy Consult for amiodarone DDI  No Known Allergies  Patient Measurements: Height: 5\' 2"  (157.5 cm) Weight: 121 lb 0.5 oz (54.9 kg) IBW/kg (Calculated) : 50.1  Vital Signs: Temp: 98.5 F (36.9 C) (07/30 0900) Temp Source: Oral (07/30 0900) BP: 157/129 (07/30 1210) Pulse Rate: 88 (07/30 1210) Intake/Output from previous day: 07/29 0701 - 07/30 0700 In: 359.7 [P.O.:240; I.V.:19.7; IV Piggyback:100] Out: 300 [Urine:300] Intake/Output from this shift: Total I/O In: 480 [P.O.:480] Out: 200 [Urine:200]  Labs:  Recent Labs  04/14/17 1522 04/15/17 0038 04/16/17 0345  WBC 15.5* 13.6* 8.7  HGB 15.6 15.8 14.9  HCT 47.0 47.8* 44.5  PLT 322 316 331  CREATININE 0.48 0.60 0.70  MG  --  1.6* 2.4  PHOS  --   --  1.6*  ALBUMIN 2.8*  --   --   PROT 6.3*  --   --   AST 24  --   --   ALT 13*  --   --   ALKPHOS 140*  --   --   BILITOT 0.5  --   --    Estimated Creatinine Clearance: 56.9 mL/min (by C-G formula based on SCr of 0.7 mg/dL).  Medications:  Prescriptions Prior to Admission  Medication Sig Dispense Refill Last Dose  . acetaminophen (TYLENOL) 325 MG tablet Take 2 tablets (650 mg total) by mouth every 6 (six) hours as needed for mild pain (or Fever >/= 101). 30 tablet 0 prn at prn  . albuterol (PROVENTIL HFA;VENTOLIN HFA) 108 (90 Base) MCG/ACT inhaler Inhale 1-2 puffs into the lungs every 6 (six) hours as needed for wheezing or shortness of breath. 6.7 g 0 prn at prn  . beclomethasone (QVAR) 80 MCG/ACT inhaler Inhale 2 puffs into the lungs 2 (two) times daily. 1 Inhaler 0 unknown at unknown  . budesonide (PULMICORT) 0.25 MG/2ML nebulizer solution Take 2 mLs (0.25 mg total) by nebulization 2 (two) times daily. 60 mL 12 unknown at unknown  . ibuprofen (ADVIL,MOTRIN) 200 MG tablet Take 200 mg by mouth every 6 (six) hours as needed.   prn at prn  . nicotine (NICODERM CQ - DOSED IN MG/24 HOURS) 21 mg/24hr patch Place 1 patch (21  mg total) onto the skin daily. (Patient not taking: Reported on 04/14/2017) 28 patch 0 Not Taking at Unknown time  . predniSONE (DELTASONE) 20 MG tablet Take 2 tablets (40 mg total) by mouth daily with breakfast. (Patient not taking: Reported on 04/14/2017) 4 tablet 0 Completed Course at Unknown time   Scheduled:  . amiodarone  400 mg Oral BID  . aspirin EC  81 mg Oral Daily  . budesonide (PULMICORT) nebulizer solution  0.25 mg Nebulization BID  . carvedilol  6.25 mg Oral BID WC  . [START ON 04/17/2017] doxycycline  100 mg Oral Q12H  . enoxaparin (LOVENOX) injection  40 mg Subcutaneous Q24H  . feeding supplement (ENSURE ENLIVE)  237 mL Oral BID BM  . folic acid  1 mg Oral Daily  . furosemide  20 mg Intravenous Q12H  . insulin aspart  0-15 Units Subcutaneous TID WC  . insulin aspart  0-5 Units Subcutaneous QHS  . lisinopril  2.5 mg Oral Daily  . methylPREDNISolone (SOLU-MEDROL) injection  60 mg Intravenous Q8H  . multivitamin with minerals  1 tablet Oral Daily  . nicotine  21 mg Transdermal Daily  . phosphorus  500 mg Oral Q4H  . pneumococcal 23 valent vaccine  0.5 mL  Intramuscular Tomorrow-1000  . sodium chloride flush  3 mL Intravenous Q12H  . thiamine  100 mg Oral Daily  . umeclidinium-vilanterol  1 puff Inhalation Daily  . vitamin B-12  1,000 mcg Oral Daily   Infusions:  . sodium chloride     PRN: sodium chloride, acetaminophen **OR** acetaminophen, bisacodyl, HYDROcodone-acetaminophen, ipratropium-albuterol, morphine injection, [DISCONTINUED] ondansetron **OR** ondansetron (ZOFRAN) IV, senna-docusate, sodium chloride flush  Assessment: Pharmacy consulted to check drug interactions with amiodarone.  Plan:  Amiodarone combined with levofloxacin and ondansetron can cause QTc prolongation. Recommend closely monitoring EKG and QTc and changing antimicrobial therapy if QTc is elevated.  7/30: levofloxacin changed to doxycycline; no doses of ondansetron have been charted as given. EKG  ordered.   Marty HeckWang, Esbeidy Mclaine L, PharmD Clinical Pharmacist 04/16/2017,2:06 PM

## 2017-04-16 NOTE — Care Management (Addendum)
This RNCM notified by Laveda AbbeLorie at [facility]- patient never received services from their clinic however she did receive application to both [facility] and [facility]. Also notified by Kathie RhodesBetty at medication management that patient has not been seen at their clinic either.

## 2017-04-16 NOTE — Progress Notes (Signed)
SUBJECTIVE: Patient is much more alert and oriented and less short of breath and denies chest pain today   Vitals:   04/16/17 0438 04/16/17 0500 04/16/17 0600 04/16/17 0747  BP:  (!) 81/50 (!) 80/56 (!) 84/61  Pulse:  83 80   Resp:  12 11   Temp:      TempSrc:      SpO2:  95% 93%   Weight: 121 lb 0.5 oz (54.9 kg)     Height:        Intake/Output Summary (Last 24 hours) at 04/16/17 0825 Last data filed at 04/16/17 0400  Gross per 24 hour  Intake            359.7 ml  Output              300 ml  Net             59.7 ml    LABS: Basic Metabolic Panel:  Recent Labs  47/82/9507/29/18 0038 04/16/17 0345  NA 139 134*  K 4.0 3.8  CL 100* 95*  CO2 32 32  GLUCOSE 120* 303*  BUN 9 16  CREATININE 0.60 0.70  CALCIUM 8.6* 8.8*  MG 1.6* 2.4  PHOS  --  1.6*   Liver Function Tests:  Recent Labs  04/14/17 1522  AST 24  ALT 13*  ALKPHOS 140*  BILITOT 0.5  PROT 6.3*  ALBUMIN 2.8*   No results for input(s): LIPASE, AMYLASE in the last 72 hours. CBC:  Recent Labs  04/15/17 0038 04/16/17 0345  WBC 13.6* 8.7  HGB 15.8 14.9  HCT 47.8* 44.5  MCV 103.8* 103.1*  PLT 316 331   Cardiac Enzymes:  Recent Labs  04/14/17 1522 04/14/17 1931 04/15/17 0038  TROPONINI 0.06* 0.04* 0.04*   BNP: Invalid input(s): POCBNP D-Dimer: No results for input(s): DDIMER in the last 72 hours. Hemoglobin A1C: No results for input(s): HGBA1C in the last 72 hours. Fasting Lipid Panel: No results for input(s): CHOL, HDL, LDLCALC, TRIG, CHOLHDL, LDLDIRECT in the last 72 hours. Thyroid Function Tests: No results for input(s): TSH, T4TOTAL, T3FREE, THYROIDAB in the last 72 hours.  Invalid input(s): FREET3 Anemia Panel: No results for input(s): VITAMINB12, FOLATE, FERRITIN, TIBC, IRON, RETICCTPCT in the last 72 hours.   PHYSICAL EXAM General: Well developed, well nourished, in no acute distress HEENT:  Normocephalic and atramatic Neck:  No JVD.  Lungs: Clear bilaterally to auscultation and  percussion. Heart: HRRR . Normal S1 and S2 without gallops or murmurs.  Abdomen: Bowel sounds are positive, abdomen soft and non-tender  Msk:  Back normal, normal gait. Normal strength and tone for age. Extremities: No clubbing, cyanosis or edema.   Neuro: Alert and oriented X 3. Psych:  Good affect, responds appropriately  TELEMETRY:Sinus rhythm  ASSESSMENT AND PLAN: Congestive heart failure due to diastolic dysfunction and respiratory failure due to hypercarbia requiring CPAP overnight. Patient is feeling much better after getting good night sleep. Echocardiogram had ejection fraction 60% with mild mitral regurgitation. But had diastolic dysfunction. May go to telemetry if it's okay with respiratory.  Active Problems:   Acute CHF (congestive heart failure) (HCC)    KHAN,SHAUKAT A, MD, [facility] 04/16/2017 8:25 AM

## 2017-04-16 NOTE — Telephone Encounter (Signed)
-----   Message from Merwyn Katosavid B Simonds, MD sent at 04/16/2017  9:19 AM EDT ----- Post hospital visit in 3-4 wks with me if possible or any provider  Billy Fischeravid Simonds, MD PCCM service Mobile [phone] Pager [phone] 04/16/2017 9:19 AM

## 2017-04-16 NOTE — Progress Notes (Signed)
Initial Nutrition Assessment  DOCUMENTATION CODES:   Not applicable  INTERVENTION:  1. Ensure Enlive po BID, each supplement provides 350 kcal and 20 grams of protein 2. MVI w/ Minerals 3. Continue Thiamine, Folic Acid NUTRITION DIAGNOSIS:   Inadequate oral intake related to poor appetite, social / environmental circumstances as evidenced by per patient/family report.  GOAL:   Patient will meet greater than or equal to 90% of their needs  MONITOR:   PO intake, I & O's, Labs, Supplement acceptance, Weight trends  REASON FOR ASSESSMENT:   Malnutrition Screening Tool    ASSESSMENT:   66 yo female with history of ETOH abuse, COPD, Bronchitis presents with SOB and cough for 3 days with progressive dyspnea on exertion and progressive orthopnea found to have acute respiratory failure due to acute COPD exacerbation with possible PNA and CHF.  Spoke with patient at bedside, she reports her husband having passed away 5 weeks ago. States "I am a nibbler," normally eats 2 meals per day consisting of meat and vegetables. Medium sized meals. Also abuses ETOH regularly. Denies any recent weight loss, per chart appears stable. Denies any issues chewing/swallowing/choking. Nutrition-Focused physical exam completed. Findings are no  fat depletion, severe muscle depletion, and mild edema.  Patient would like ensure during stay. Will help with overall nutrition status. Ate ~50% of lunch.  Intake/Output Summary (Last 24 hours) at 04/16/17 1308 Last data filed at 04/16/17 1000  Gross per 24 hour  Intake            839.7 ml  Output              500 ml  Net            339.7 ml   Labs reviewed:  Na 134, Phos 1.6 Medications reviewed and include:  Folic Acid, Solumedrol, Thiamine, B12, Dulcolax Meal Completion: 50%  Diet Order:  Diet Heart Room service appropriate? Yes; Fluid consistency: Thin  Skin:  Reviewed, no issues  Last BM:  04/13/2017  Height:   Ht Readings from Last 1  Encounters:  04/15/17 5\' 2"  (1.575 m)    Weight:   Wt Readings from Last 1 Encounters:  04/16/17 121 lb 0.5 oz (54.9 kg)    Ideal Body Weight:  50 kg  BMI:  Body mass index is 22.14 kg/m.  Estimated Nutritional Needs:   Kcal:  1375-1650 calories  Protein:  66-83 grams (1.2-1.5g/kg)  Fluid:  >/= 1.5L  EDUCATION NEEDS:   No education needs identified at this time  Rose AnoWilliam M. Choya Tornow, MS, RD LDN Inpatient Clinical Dietitian Pager [phone]

## 2017-04-16 NOTE — Progress Notes (Addendum)
[facility] - [place] [facility]   PATIENT NAME: Rose Torres    MR#:  [id number]  DATE OF BIRTH:  01/31/1951  SUBJECTIVE:  CHIEF COMPLAINT:   Chief Complaint  Patient presents with  . Shortness of Breath   - Patient was dyspneic and confused yesterday, transferred to ICU for BiPAP. Doing much better today. -Down to 3 L nasal cannula at this time. Alert and able to finish off her sentences. Still very tight and auscultation  REVIEW OF SYSTEMS:  Review of Systems  Constitutional: Negative for chills, fever and malaise/fatigue.  HENT: Negative for congestion, ear discharge, ear pain, hearing loss and nosebleeds.   Eyes: Negative for blurred vision.  Respiratory: Positive for shortness of breath. Negative for cough and wheezing.   Cardiovascular: Negative for chest pain, palpitations and leg swelling.  Gastrointestinal: Negative for abdominal pain, constipation, diarrhea, nausea and vomiting.  Genitourinary: Negative for dysuria.  Musculoskeletal: Negative for myalgias.  Neurological: Positive for headaches. Negative for dizziness, speech change, focal weakness and seizures.  Psychiatric/Behavioral: Negative for depression.    DRUG ALLERGIES:  No Known Allergies  VITALS:  Blood pressure 110/89, pulse 78, temperature 98.5 F (36.9 C), temperature source Oral, resp. rate (!) 23, height 5\' 2"  (1.575 m), weight 54.9 kg (121 lb 0.5 oz), SpO2 98 %.  PHYSICAL EXAMINATION:  Physical Exam  GENERAL:  66 y.o.-year-old thin built and ill-nourished patient lying in the bed, appears to be tachypneic.  EYES: Pupils equal, round, reactive to light and accommodation. No scleral icterus. Extraocular muscles intact.  HEENT: Head atraumatic, normocephalic. Oropharynx and nasopharynx clear.  NECK:  Supple, no jugular venous distention. No thyroid enlargement, no tenderness.  LUNGS: very tight on auscultation with scattered wheezes. Not using accessory muscles to breathe  with minimal exertion. No rales or rhonchi heard.  CARDIOVASCULAR: S1, S2 normal. No murmurs, rubs, or gallops.  ABDOMEN: Soft, nontender, nondistended. Bowel sounds present. No organomegaly or mass.  EXTREMITIES: No pedal edema, cyanosis, or clubbing.  NEUROLOGIC: Cranial nerves II through XII are intact. Muscle strength 5/5 in all extremities. Sensation intact. Gait not checked. Following commands PSYCHIATRIC: The patient is alert and oriented x 3, SKIN: No obvious rash, lesion, or ulcer.    LABORATORY PANEL:   CBC  Recent Labs Lab 04/16/17 0345  WBC 8.7  HGB 14.9  HCT 44.5  PLT 331   ------------------------------------------------------------------------------------------------------------------  Chemistries   Recent Labs Lab 04/14/17 1522  04/16/17 0345  NA 135  < > 134*  K 4.7  < > 3.8  CL 103  < > 95*  CO2 25  < > 32  GLUCOSE 124*  < > 303*  BUN 9  < > 16  CREATININE 0.48  < > 0.70  CALCIUM 8.6*  < > 8.8*  MG  --   < > 2.4  AST 24  --   --   ALT 13*  --   --   ALKPHOS 140*  --   --   BILITOT 0.5  --   --   < > = values in this interval not displayed. ------------------------------------------------------------------------------------------------------------------  Cardiac Enzymes  Recent Labs Lab 04/15/17 0038  TROPONINI 0.04*   ------------------------------------------------------------------------------------------------------------------  RADIOLOGY:  Dg Chest 1 View  Result Date: 04/15/2017 CLINICAL DATA:  Shortness of Breath EXAM: CHEST 1 VIEW COMPARISON:  04/14/2017 FINDINGS: Cardiomegaly with vascular congestion and interstitial prominence, likely interstitial edema. Small left pleural effusion, new since prior study. No right effusion. IMPRESSION: Stable or slightly  increased interstitial edema pattern. Small left effusion. Electronically Signed   By: Charlett NoseKevin  Dover M.D.   On: 04/15/2017 08:43   Dg Chest Portable 1 View  Result Date:  04/14/2017 CLINICAL DATA:  Patient with shortness of breath and hypoxia. EXAM: PORTABLE CHEST 1 VIEW COMPARISON:  CT chest 01/19/2017; chest radiograph 01/19/2017. FINDINGS: Monitoring leads overlie the patient. Stable cardiomegaly. Interval worsening bilateral interstitial pulmonary opacities. Trace bilateral pleural effusions. IMPRESSION: Cardiomegaly and mild interstitial edema. Electronically Signed   By: Annia Beltrew  Davis M.D.   On: 04/14/2017 15:46    EKG:   Orders placed or performed during the hospital encounter of 04/14/17  . ED EKG  . ED EKG    ASSESSMENT AND PLAN:   66 year old female with past medical history significant for alcohol abuse, COPD not currently on home oxygen admitted with shortness of breath  #1 Acute hypoxic respiratory failure- initially admitted for CHF exacerbation - also has COPD exacerbation, was on BiPAP all day yesterday with much improvement today. -Continue IV steroids, appreciate pulmonary consult - continue o2 support- on 3L o2 now - not on home o2 recently- but was in the past, recently was weaned off few weeks ago from oxygen. Might need oxygen at discharge - levaquin added as well  #2 CHF- acute diastolic CHF. -Echo with normal EF. LVH noted - alcohol induced cardiomyopathy -Appreciate cardiology consult. Continue Lasix - on lisinopril, coreg added  #3 Alcohol abuse, no recent alc level or drug screen done - From may 2018- alc level significantly elevated Husband passed away 5 weeks ago Monitor and counsel prior to discharge  #4 Elevated troponin- demand ischemia, stable troponins - ECHO, cards consulted -SVT last night, started on amiodarone drip. Now converted to sinus rhythm and amiodarone changed to oral  #5 DVT Prophylaxis- lovenox  Physical Therapy consult      All the records are reviewed and case discussed with Care Management/Social Workerr. Management plans discussed with the patient, family and they are in agreement.  CODE  STATUS: full code  TOTAL TIME spent in TAKING CARE OF THIS PATIENT: 38 minutes.   POSSIBLE D/C IN 2-3 DAYS, DEPENDING ON CLINICAL CONDITION.   Mansoor Hillyard M.D on 04/16/2017 at 10:54 AM  Between 7am to 6pm - Pager - [phone]  After 6pm go to [email or web address] - password [email or web address]  [facility]  Office  [phone]  CC: Primary care physician; Patient, No Pcp Per

## 2017-04-16 NOTE — Care Management (Signed)
Appointment date/time with [facility] placed on discharge follow up instructions.

## 2017-04-16 NOTE — Care Management (Signed)
Patient confirms she has home oxygen but does not have any portable tanks.  Will contact [facility] to discuss.  Patient does not have a way to transport empty tanks to  local center.  Could she qualify for concentrator with refillable portable?  patient does not have access to scales.  CM will provide.  Discussed [facility] and agreeable to referral.  Needs enough notice of appointment to arrange for transportation.

## 2017-04-16 NOTE — Care Management (Signed)
Per [facility] patient has O2 (had over a year) through their company and is supposed to wear 24/7.

## 2017-04-16 NOTE — Progress Notes (Addendum)
No new complaints No distress on [place] O2 Cognition intact  Vitals:   04/16/17 1445 04/16/17 1500 04/16/17 1515 04/16/17 1539  BP: (!) 73/50 (!) 86/54 (!) 83/58 (!) 87/51  Pulse: 82 83 80 82  Resp: 13 14 14 18   Temp:    97.6 F (36.4 C)  TempSrc:    Oral  SpO2: 97% 100% 98% 97%  Weight:      Height:         NAD HEENT WNL No wheezes RRR, no M NABS No LE edema No focal neurologic deficits  BMP Latest Ref Rng & Units 04/16/2017 04/15/2017 04/14/2017  Glucose 65 - 99 mg/dL 130(Q303(H) 657(Q120(H) 469(G124(H)  BUN 6 - 20 mg/dL 16 9 9   Creatinine 0.44 - 1.00 mg/dL 2.950.70 2.840.60 1.320.48  Sodium 135 - 145 mmol/L 134(L) 139 135  Potassium 3.5 - 5.1 mmol/L 3.8 4.0 4.7  Chloride 101 - 111 mmol/L 95(L) 100(L) 103  CO2 22 - 32 mmol/L 32 32 25  Calcium 8.9 - 10.3 mg/dL 4.4(W8.8(L) 1.0(U8.6(L) 7.2(Z8.6(L)    . CBC Latest Ref Rng & Units 04/16/2017 04/15/2017 04/14/2017  WBC 3.6 - 11.0 K/uL 8.7 13.6(H) 15.5(H)  Hemoglobin 12.0 - 16.0 g/dL 36.614.9 44.015.8 34.715.6  Hematocrit 35.0 - 47.0 % 44.5 47.8(H) 47.0  Platelets 150 - 440 K/uL 331 316 322   No new CXR  IMPRESSION: Acute on chronic hypoxemic/hypercarbic respiratory failure - improving AE COPD Smoker Borderline hypotension Minimally elevated troponin I  PLAN/REC: Normal saline bolus administered this morning Discontinue lisinopril due to hypotension Decrease carvedilol due to hypotension Change amiodarone to by mouth Discontinue budesonide Begin Anoro - one inhalation daily. This should be her discharge maintenance medication Change DuoNeb to every 4 hours when necessary Counseled regarding smoking cessation Transfer to MedSurg  After transfer, PCCM will sign off. Please call if we can be of further assistance  I will schedule follow-up with her in the office in 3-4 weeks after discharge.  Her discharge COPD regimen should be as follows: Prednisone taper to off over 5-7 days Complete 7 days antibiotics with doxycycline Anoro inhaler, one inhalation daily Albuterol  inhaler as needed Oxygen therapy as previously prescribed   Billy Fischeravid Aerielle Stoklosa, MD PCCM service Mobile [phone] Pager [phone] 04/16/2017 4:55 PM

## 2017-04-16 NOTE — Progress Notes (Signed)
Inpatient Diabetes Program Recommendations  AACE/ADA: New Consensus Statement on Inpatient Glycemic Control (2015)  Target Ranges:  Prepandial:   less than 140 mg/dL      Peak postprandial:   less than 180 mg/dL (1-2 hours)      Critically ill patients:  140 - 180 mg/dL   Lab Results  Component Value Date   GLUCAP 147 (H) 04/15/2017   HGBA1C 4.7 12/06/2015    Review of Glycemic ControlResults for Rose Torres, [name] (MRN [id number]) as of 04/16/2017 11:34  Ref. Range 04/16/2017 03:45  Glucose Latest Ref Range: 65 - 99 mg/dL 409303 (H)    Diabetes history: None Current orders for Inpatient glycemic control:  None, Solu-medrol 60 mg IV q 8 hours  Inpatient Diabetes Program Recommendations:   Please consider adding Novolog sensitive tid with meals and HS while patient is on steroids.  Thanks, Beryl MeagerJenny Millissa Deese, RN, BC-ADM Inpatient Diabetes Coordinator Pager [phone] (8a-5p)

## 2017-04-16 NOTE — Care Management Note (Signed)
Case Management Note  Patient Details  Name: Rose Torres MRN: [id number] Date of Birth: Jan 17, 1951  Subjective/Objective:                   Met with patient to discuss discharge planning. She states that she is from home alone as her husband died 6 weeks ago. She states she has a son that visits and he lives in [place]. She seemed hesitant to talk to me about son. She states she relies on friends for transportation however says this is not a problems with enough notice she can get to her appointments. No PCP. She remembers receiving applications to [facility] and medication management from previous RNCM however she never applied for assistance. She agrees to follow through with both. She states she has home O2 but only wears it a needed. Patient does not have health insurance.  Action/Plan: Referral to [facility] and Medications Management. I encouraged patient to go to Medication Management for assistance with medications. Patient does not have a payer for home health. Referral to [facility] for charity services if medically necessary at time of discharge- again patient has no PCP.   Expected Discharge Date:                  Expected Discharge Plan:     In-House Referral:     Discharge planning Services  CM Consult, [facility], Medication Assistance  Post Acute Care Choice:    Choice offered to:  Patient  DME Arranged:    DME Agency:     HH Arranged:    [place] Agency:     Status of Service:  Completed, signed off  If discussed at [employer], dates discussed:    Additional Comments:  Marshell Garfinkel, RN 04/16/2017, 2:34 PM

## 2017-04-16 NOTE — Progress Notes (Signed)
   04/16/17 1415  Vitals  BP (!) 67/37  MAP (mmHg) (!) 45  Pulse Rate 82  ECG Heart Rate 82  Resp 13  Oxygen Therapy  SpO2 100 %   500 ml NS bolus given

## 2017-04-17 LAB — BASIC METABOLIC PANEL
ANION GAP: 8 (ref 5–15)
BUN: 20 mg/dL (ref 6–20)
CALCIUM: 8.8 mg/dL — AB (ref 8.9–10.3)
CO2: 34 mmol/L — ABNORMAL HIGH (ref 22–32)
Chloride: 98 mmol/L — ABNORMAL LOW (ref 101–111)
Creatinine, Ser: 0.75 mg/dL (ref 0.44–1.00)
Glucose, Bld: 183 mg/dL — ABNORMAL HIGH (ref 65–99)
Potassium: 3.5 mmol/L (ref 3.5–5.1)
Sodium: 140 mmol/L (ref 135–145)

## 2017-04-17 LAB — GLUCOSE, CAPILLARY
GLUCOSE-CAPILLARY: 142 mg/dL — AB (ref 65–99)
Glucose-Capillary: 256 mg/dL — ABNORMAL HIGH (ref 65–99)
Glucose-Capillary: 95 mg/dL (ref 65–99)
Glucose-Capillary: 143 mg/dL — ABNORMAL HIGH (ref 65–99)

## 2017-04-17 LAB — HEMOGLOBIN A1C
HEMOGLOBIN A1C: 5.2 % (ref 4.8–5.6)
MEAN PLASMA GLUCOSE: 103 mg/dL

## 2017-04-17 LAB — PHOSPHORUS: Phosphorus: 2.3 mg/dL — ABNORMAL LOW (ref 2.5–4.6)

## 2017-04-17 MED ORDER — TEMAZEPAM 7.5 MG PO CAPS
7.5000 mg | ORAL_CAPSULE | Freq: Every day | ORAL | Status: DC
  Administered 2017-04-17: 7.5 mg via ORAL
  Filled 2017-04-17: qty 1

## 2017-04-17 MED ORDER — K PHOS MONO-SOD PHOS DI & MONO 155-852-130 MG PO TABS
500.0000 mg | ORAL_TABLET | ORAL | Status: AC
  Administered 2017-04-17: 500 mg via ORAL
  Filled 2017-04-17 (×2): qty 2

## 2017-04-17 MED ORDER — AMIODARONE HCL 200 MG PO TABS
200.0000 mg | ORAL_TABLET | Freq: Two times a day (BID) | ORAL | Status: DC
  Administered 2017-04-17 – 2017-04-18 (×3): 200 mg via ORAL
  Filled 2017-04-17 (×2): qty 1

## 2017-04-17 NOTE — Care Management (Signed)
Spoke with attending and patient's sx are more related to COPD.  Discussed room air ambulatory sats during progression.  [facility] unable to staff nursing so looking for funding for 3 home nursing visits.  [facility].  Will ask [facility] to provide portable oxygen for transport home if needed.  patient instructed portable tanks are not delivered to the home- patient or representative must pick them up

## 2017-04-17 NOTE — Progress Notes (Signed)
SUBJECTIVE: Patient denies any chest pain and feeling much better with less shortness of breath eating breakfast without any problems.   Vitals:   04/16/17 1701 04/16/17 1928 04/17/17 0441 04/17/17 0805  BP: (!) 91/46 (!) 94/52 (!) 93/52 102/67  Pulse: 86 81 83 81  Resp:  18 18   Temp:  98 F (36.7 C) 98 F (36.7 C)   TempSrc:   Oral   SpO2:  95% 91% 92%  Weight:   121 lb 12.8 oz (55.2 kg)   Height:        Intake/Output Summary (Last 24 hours) at 04/17/17 0827 Last data filed at 04/17/17 0430  Gross per 24 hour  Intake              240 ml  Output             1075 ml  Net             -835 ml    LABS: Basic Metabolic Panel:  Recent Labs  13/04/6506/29/18 0038 04/16/17 0345 04/17/17 0459  NA 139 134* 140  K 4.0 3.8 3.5  CL 100* 95* 98*  CO2 32 32 34*  GLUCOSE 120* 303* 183*  BUN 9 16 20   CREATININE 0.60 0.70 0.75  CALCIUM 8.6* 8.8* 8.8*  MG 1.6* 2.4  --   PHOS  --  1.6* 2.3*   Liver Function Tests:  Recent Labs  04/14/17 1522  AST 24  ALT 13*  ALKPHOS 140*  BILITOT 0.5  PROT 6.3*  ALBUMIN 2.8*   No results for input(s): LIPASE, AMYLASE in the last 72 hours. CBC:  Recent Labs  04/15/17 0038 04/16/17 0345  WBC 13.6* 8.7  HGB 15.8 14.9  HCT 47.8* 44.5  MCV 103.8* 103.1*  PLT 316 331   Cardiac Enzymes:  Recent Labs  04/14/17 1522 04/14/17 1931 04/15/17 0038  TROPONINI 0.06* 0.04* 0.04*   BNP: Invalid input(s): POCBNP D-Dimer: No results for input(s): DDIMER in the last 72 hours. Hemoglobin A1C:  Recent Labs  04/16/17 0345  HGBA1C 5.2   Fasting Lipid Panel: No results for input(s): CHOL, HDL, LDLCALC, TRIG, CHOLHDL, LDLDIRECT in the last 72 hours. Thyroid Function Tests: No results for input(s): TSH, T4TOTAL, T3FREE, THYROIDAB in the last 72 hours.  Invalid input(s): FREET3 Anemia Panel: No results for input(s): VITAMINB12, FOLATE, FERRITIN, TIBC, IRON, RETICCTPCT in the last 72 hours.   PHYSICAL EXAM General: Well developed, well  nourished, in no acute distress HEENT:  Normocephalic and atramatic Neck:  No JVD.  Lungs: Clear bilaterally to auscultation and percussion. Heart: HRRR . Normal S1 and S2 without gallops or murmurs.  Abdomen: Bowel sounds are positive, abdomen soft and non-tender  Msk:  Back normal, normal gait. Normal strength and tone for age. Extremities: No clubbing, cyanosis or edema.   Neuro: Alert and oriented X 3. Psych:  Good affect, responds appropriately  TELEMETRY:Sinus rhythm F2  ASSESSMENT AND PLAN: Congestive heart failure due to diastolic dysfunction and probable obstructive sleep apnea as significant improvement with CPAP treatment. Mildly elevated troponin due to demand ischemia. We will outpatient workup and gave a card to the patient with follow-up in the office Friday at 10:00.  Active Problems:   Acute CHF (congestive heart failure) (HCC)    Rose Torres A, MD, [facility] 04/17/2017 8:27 AM

## 2017-04-17 NOTE — Progress Notes (Signed)
MEDICATION RELATED CONSULT NOTE - Follow up  Pharmacy Consult for amiodarone DDI  No Known Allergies  Patient Measurements: Height: 5\' 2"  (157.5 cm) Weight: 121 lb 12.8 oz (55.2 kg) IBW/kg (Calculated) : 50.1  Vital Signs: Temp: 98 F (36.7 C) (07/31 1105) Temp Source: Oral (07/31 1105) BP: 104/66 (07/31 1148) Pulse Rate: 104 (07/31 1105) Intake/Output from previous day: 07/30 0701 - 07/31 0700 In: 480 [P.O.:480] Out: 1075 [Urine:1075] Intake/Output from this shift: Total I/O In: 120 [P.O.:120] Out: -   Labs:  Recent Labs  04/14/17 1522 04/15/17 0038 04/16/17 0345 04/17/17 0459  WBC 15.5* 13.6* 8.7  --   HGB 15.6 15.8 14.9  --   HCT 47.0 47.8* 44.5  --   PLT 322 316 331  --   CREATININE 0.48 0.60 0.70 0.75  MG  --  1.6* 2.4  --   PHOS  --   --  1.6* 2.3*  ALBUMIN 2.8*  --   --   --   PROT 6.3*  --   --   --   AST 24  --   --   --   ALT 13*  --   --   --   ALKPHOS 140*  --   --   --   BILITOT 0.5  --   --   --    Estimated Creatinine Clearance: 56.9 mL/min (by C-G formula based on SCr of 0.75 mg/dL).  Medications:  Prescriptions Prior to Admission  Medication Sig Dispense Refill Last Dose  . acetaminophen (TYLENOL) 325 MG tablet Take 2 tablets (650 mg total) by mouth every 6 (six) hours as needed for mild pain (or Fever >/= 101). 30 tablet 0 prn at prn  . albuterol (PROVENTIL HFA;VENTOLIN HFA) 108 (90 Base) MCG/ACT inhaler Inhale 1-2 puffs into the lungs every 6 (six) hours as needed for wheezing or shortness of breath. 6.7 g 0 prn at prn  . beclomethasone (QVAR) 80 MCG/ACT inhaler Inhale 2 puffs into the lungs 2 (two) times daily. 1 Inhaler 0 unknown at unknown  . budesonide (PULMICORT) 0.25 MG/2ML nebulizer solution Take 2 mLs (0.25 mg total) by nebulization 2 (two) times daily. 60 mL 12 unknown at unknown  . ibuprofen (ADVIL,MOTRIN) 200 MG tablet Take 200 mg by mouth every 6 (six) hours as needed.   prn at prn  . nicotine (NICODERM CQ - DOSED IN MG/24  HOURS) 21 mg/24hr patch Place 1 patch (21 mg total) onto the skin daily. (Patient not taking: Reported on 04/14/2017) 28 patch 0 Not Taking at Unknown time  . predniSONE (DELTASONE) 20 MG tablet Take 2 tablets (40 mg total) by mouth daily with breakfast. (Patient not taking: Reported on 04/14/2017) 4 tablet 0 Completed Course at Unknown time   Scheduled:  . amiodarone  200 mg Oral BID  . aspirin EC  81 mg Oral Daily  . doxycycline  100 mg Oral Q12H  . enoxaparin (LOVENOX) injection  40 mg Subcutaneous Q24H  . feeding supplement (ENSURE ENLIVE)  237 mL Oral BID BM  . folic acid  1 mg Oral Daily  . insulin aspart  0-15 Units Subcutaneous TID WC  . insulin aspart  0-5 Units Subcutaneous QHS  . methylPREDNISolone (SOLU-MEDROL) injection  40 mg Intravenous Q12H  . multivitamin with minerals  1 tablet Oral Daily  . nicotine  21 mg Transdermal Daily  . pneumococcal 23 valent vaccine  0.5 mL Intramuscular Tomorrow-1000  . sodium chloride flush  3 mL Intravenous Q12H  .  temazepam  7.5 mg Oral QHS  . thiamine  100 mg Oral Daily  . umeclidinium-vilanterol  1 puff Inhalation Daily  . vitamin B-12  1,000 mcg Oral Daily   Infusions:  . sodium chloride     Assessment: Pharmacy consulted to check drug interactions with amiodarone.  Plan:  Amiodarone combined with levofloxacin and ondansetron can cause QTc prolongation. Recommend closely monitoring EKG and QTc and changing antimicrobial therapy if QTc is elevated.  7/30: levofloxacin changed to doxycycline; no doses of ondansetron have been charted as given. EKG ordered.  7/31: Medication list reviewed. Anoro Ellipta does show indeterminate risk of QTc prolongation but is an orally inhaled medication; discussed with MD. EKG reviewed and discussed with MD. MD stated amiodarone dose decreased today. Per MD, will order another EKG for tomorrow AM and continue to monitor pt.     Marty HeckWang, Masami Plata L, PharmD Clinical Pharmacist 04/17/2017,2:37 PM

## 2017-04-17 NOTE — Progress Notes (Signed)
SATURATION QUALIFICATIONS: (This note is used to comply with regulatory documentation for home oxygen)  Patient Saturations on Room Air at Rest = 88%  Patient Saturations on Room Air while Ambulating = 81%  Patient Saturations on 2 Liters of oxygen while resting = 95%  Please briefly explain why patient needs home oxygen:

## 2017-04-17 NOTE — Progress Notes (Signed)
[facility] - [place] [facility]   PATIENT NAME: Rose Torres    MR#:  [id number]  DATE OF BIRTH:  03/17/1951  SUBJECTIVE:  CHIEF COMPLAINT:   Chief Complaint  Patient presents with  . Shortness of Breath   - Complains of knee pain today. Did not sleep well due to anxiety all night long. -Weaned off oxygen at rest.  REVIEW OF SYSTEMS:  Review of Systems  Constitutional: Negative for chills, fever and malaise/fatigue.  HENT: Negative for congestion, ear discharge, ear pain, hearing loss and nosebleeds.   Eyes: Negative for blurred vision.  Respiratory: Positive for shortness of breath. Negative for cough and wheezing.   Cardiovascular: Negative for chest pain, palpitations and leg swelling.  Gastrointestinal: Negative for abdominal pain, constipation, diarrhea, nausea and vomiting.  Genitourinary: Negative for dysuria.  Musculoskeletal: Negative for myalgias.  Neurological: Positive for headaches. Negative for dizziness, speech change, focal weakness and seizures.  Psychiatric/Behavioral: Negative for depression.    DRUG ALLERGIES:  No Known Allergies  VITALS:  Blood pressure 102/67, pulse 81, temperature 98 F (36.7 C), temperature source Oral, resp. rate 18, height 5\' 2"  (1.575 m), weight 55.2 kg (121 lb 12.8 oz), SpO2 92 %.  PHYSICAL EXAMINATION:  Physical Exam  GENERAL:  66 y.o.-year-old well nourished patient lying in the bed, appears well, not in any acute distress.  EYES: Pupils equal, round, reactive to light and accommodation. No scleral icterus. Extraocular muscles intact.  HEENT: Head atraumatic, normocephalic. Oropharynx and nasopharynx clear.  NECK:  Supple, no jugular venous distention. No thyroid enlargement, no tenderness.  LUNGS: Moving air bilaterally, significantly improved wheezing.. Not using accessory muscles to breathe with minimal exertion. No rales or rhonchi heard.  CARDIOVASCULAR: S1, S2 normal. No murmurs, rubs, or  gallops.  ABDOMEN: Soft, nontender, nondistended. Bowel sounds present. No organomegaly or mass.  EXTREMITIES: No pedal edema, cyanosis, or clubbing.  NEUROLOGIC: Cranial nerves II through XII are intact. Muscle strength 5/5 in all extremities. Sensation intact. Gait not checked. Following commands PSYCHIATRIC: The patient is alert and oriented x 3, SKIN: No obvious rash, lesion, or ulcer.    LABORATORY PANEL:   CBC  Recent Labs Lab 04/16/17 0345  WBC 8.7  HGB 14.9  HCT 44.5  PLT 331   ------------------------------------------------------------------------------------------------------------------  Chemistries   Recent Labs Lab 04/14/17 1522  04/16/17 0345 04/17/17 0459  NA 135  < > 134* 140  K 4.7  < > 3.8 3.5  CL 103  < > 95* 98*  CO2 25  < > 32 34*  GLUCOSE 124*  < > 303* 183*  BUN 9  < > 16 20  CREATININE 0.48  < > 0.70 0.75  CALCIUM 8.6*  < > 8.8* 8.8*  MG  --   < > 2.4  --   AST 24  --   --   --   ALT 13*  --   --   --   ALKPHOS 140*  --   --   --   BILITOT 0.5  --   --   --   < > = values in this interval not displayed. ------------------------------------------------------------------------------------------------------------------  Cardiac Enzymes  Recent Labs Lab 04/15/17 0038  TROPONINI 0.04*   ------------------------------------------------------------------------------------------------------------------  RADIOLOGY:  No results found.  EKG:   Orders placed or performed during the hospital encounter of 04/14/17  . ED EKG  . ED EKG  . EKG 12-Lead  . EKG 12-Lead    ASSESSMENT AND PLAN:  66 year old female with past medical history significant for alcohol abuse, COPD not currently on home oxygen admitted with shortness of breath  #1 Acute hypoxic respiratory failure- initially admitted for CHF exacerbation - also has COPD exacerbation, Initially required BiPAP, currently weaned down to nasal cannula. This morning off oxygen at rest.  Check ambulatory saturations -Has home oxygen at home which she uses only as needed now -On doxycycline for possible bronchitis. Treat for 5 days  #2 CHF- acute diastolic CHF. -Echo with normal EF. LVH noted - alcohol induced cardiomyopathy -Appreciate cardiology consult. Continue Lasix - on lisinopril, coreg added-due to hypotension, lisinopril and Lasix on hold at this time  #3 Alcohol abuse, no recent alc level or drug screen done - From may 2018- alc level significantly elevated Husband passed away 5 weeks ago Monitor and counsel prior to discharge  #4 Elevated troponin- demand ischemia, stable troponins - ECHO, cards consulted -SVT last night, on amiodarone drip. Now converted to sinus rhythm and amiodarone changed to oral-wean at discharge  #5 DVT Prophylaxis- lovenox  Physical Therapy consulted      All the records are reviewed and case discussed with Care Management/Social Workerr. Management plans discussed with the patient, family and they are in agreement.  CODE STATUS: full code  TOTAL TIME spent in TAKING CARE OF THIS PATIENT: 38 minutes.   POSSIBLE D/C TOMORROW,  DEPENDING ON CLINICAL CONDITION.   Enid BaasKALISETTI,Kilian Schwartz M.D on 04/17/2017 at 8:56 AM  Between 7am to 6pm - Pager - [phone]  After 6pm go to [email or web address] - password [email or web address]  [facility]  Office  [phone]  CC: Primary care physician; Patient, No Pcp Per

## 2017-04-17 NOTE — Progress Notes (Signed)
MD notified. Pts SBP is 102. Coreg has parameters and is held, MD notified about amiodarone dose. MD discontinued coreg and decreased amiodarone to 200mg  BID. I will continue to assess.

## 2017-04-17 NOTE — Evaluation (Signed)
Physical Therapy Evaluation Patient Details Name: Rose Torres MRN: [id number] DOB: 12/04/1950 Today's Date: 04/17/2017   History of Present Illness  Patient is a 66 y/o female that presents with shortness of breath noted to have COPD exacerbation and CHF exacerbation. She has had sub-acute to chronic R knee pain, noted to have no findings on imaging (X-ray) in previous admission this year.   Clinical Impression  Patient presents with shortness of breath, noted to be at 88% on room air at rest and decreased to 81% during ambulation. She is noted to have dyspnea, though she reports she always has some shortness of breath. She has had sub-acute on chronic R knee pain, which was imaged with X-ray in previous admission. She displays antalgic pattern intermittently in the hallway with RW, however when receiving a phone call in the room she is able to leave the RW and ambulate quickly to chair to answer the call. [facility] and conditioning. Her O2 sats did increaee to 91-93% on 2-3L with prolonged rest break in ambulation.     Follow Up Recommendations Home health PT (Could also consider [facility] for R knee pain) -- could also consider cardio-pulm rehab    Equipment Recommendations  Rolling walker with 5" wheels    Recommendations for Other Services       Precautions / Restrictions Precautions Precautions: None Restrictions Weight Bearing Restrictions: No      Mobility  Bed Mobility Overal bed mobility: Independent             General bed mobility comments: No deficits in bed mobility noted.   Transfers Overall transfer level: Modified independent Equipment used: Rolling walker (2 wheeled)             General transfer comment: No deficits in transition from sit to stand noted.   Ambulation/Gait Ambulation/Gait assistance: Supervision Ambulation Distance (Feet): 100 Feet Assistive device: Rolling walker  (2 wheeled) Gait Pattern/deviations: Antalgic   Gait velocity interpretation: <1.8 ft/sec, indicative of risk for recurrent falls General Gait Details: Patient noted to have intermittent antalgic pattern with flexed R knee. No buckling noted, reported she has been seeing an MD for knee pain.  Stairs            Wheelchair Mobility    Modified Rankin (Stroke Patients Only)       Balance Overall balance assessment: Modified Independent                                           Pertinent Vitals/Pain Pain Assessment: Faces Faces Pain Scale: Hurts little more Pain Location: R knee  Pain Descriptors / Indicators: Aching Pain Intervention(s): Limited activity within patient's tolerance;Monitored during session;Repositioned;Ice applied    Home Living Family/patient expects to be discharged to:: Private residence Living Arrangements: Alone Available Help at Discharge: Family;Available 24 hours/day;Other (Comment) Type of Home: Apartment Home Access: Level entry     Home Layout: One level Home Equipment: Cane - single point;Walker - 2 wheels Additional Comments: It sounds like she received a RW during last admission.     Prior Function Level of Independence: Independent with assistive device(s)         Comments: Was using a SPC and noted to have falls, she makes it appear as if she may have transitioned to a RW recently.  Hand Dominance        Extremity/Trunk Assessment   Upper Extremity Assessment Upper Extremity Assessment: Overall WFL for tasks assessed    Lower Extremity Assessment Lower Extremity Assessment: RLE deficits/detail RLE Deficits / Details: Noted to ambulate intermittently with flexed R knee secondary to pain.        Communication   Communication: No difficulties  Cognition Arousal/Alertness: Awake/alert Behavior During Therapy: WFL for tasks assessed/performed Overall Cognitive Status: Within Functional Limits for  tasks assessed                                        General Comments      Exercises     Assessment/Plan    PT Assessment Patient needs continued PT services  PT Problem List Decreased strength;Decreased mobility;Cardiopulmonary status limiting activity;Decreased balance;Decreased knowledge of use of DME;Pain;Decreased activity tolerance       PT Treatment Interventions DME instruction;Gait training;Therapeutic activities;Therapeutic exercise;Balance training;Stair training;Functional mobility training;Neuromuscular re-education;Manual techniques    PT Goals (Current goals can be found in the Care Plan section)  Acute Rehab PT Goals Patient Stated Goal: To return home when able  PT Goal Formulation: With patient Time For Goal Achievement: 05/01/17 Potential to Achieve Goals: Good    Frequency Min 2X/week   Barriers to discharge        Co-evaluation               AM-PAC PT "6 Clicks" Daily Activity  Outcome Measure Difficulty turning over in bed (including adjusting bedclothes, sheets and blankets)?: None Difficulty moving from lying on back to sitting on the side of the bed? : None Difficulty sitting down on and standing up from a chair with arms (e.g., wheelchair, bedside commode, etc,.)?: None Help needed moving to and from a bed to chair (including a wheelchair)?: None Help needed walking in hospital room?: A Little Help needed climbing 3-5 steps with a railing? : A Little 6 Click Score: 22    End of Session Equipment Utilized During Treatment: Gait belt Activity Tolerance: Patient tolerated treatment well;Patient limited by pain Patient left: in chair;with chair alarm set;with call bell/phone within reach Nurse Communication: Mobility status (O2 sats) PT Visit Diagnosis: Muscle weakness (generalized) (M62.81);Difficulty in walking, not elsewhere classified (R26.2)    Time: 1126-1140 PT Time Calculation (min) (ACUTE ONLY): 14  min   Charges:   PT Evaluation $PT Eval Moderate Complexity: 1 Mod     PT G Codes:   PT G-Codes **NOT FOR INPATIENT CLASS** Functional Assessment Tool Used: AM-PAC 6 Clicks Basic Mobility Functional Limitation: Mobility: Walking and moving around Mobility: Walking and Moving Around Current Status ([id number](G8978): At least 20 percent but less than 40 percent impaired, limited or restricted Mobility: Walking and Moving Around Goal Status [phone](G8979): At least 1 percent but less than 20 percent impaired, limited or restricted   Alva GarnetPatrick Ezell Melikian PT, DPT, CSCS     04/17/2017, 11:57 AM

## 2017-04-17 NOTE — Progress Notes (Signed)
Pt still complaining of 8/10 pain in bilateral knees and generalized. Second tablet in range dose prn pain medication given. Ice packs applied to bilateral knees. I will continue to assess.

## 2017-04-18 LAB — GLUCOSE, CAPILLARY
GLUCOSE-CAPILLARY: 115 mg/dL — AB (ref 65–99)
Glucose-Capillary: 261 mg/dL — ABNORMAL HIGH (ref 65–99)

## 2017-04-18 LAB — PHOSPHORUS: Phosphorus: 2.4 mg/dL — ABNORMAL LOW (ref 2.5–4.6)

## 2017-04-18 MED ORDER — BUDESONIDE-FORMOTEROL FUMARATE 160-4.5 MCG/ACT IN AERO: 2.0000 | INHALATION_SPRAY | Freq: Two times a day (BID) | RESPIRATORY_TRACT | 12 refills | Status: AC

## 2017-04-18 MED ORDER — IPRATROPIUM-ALBUTEROL 0.5-2.5 (3) MG/3ML IN SOLN: 3.0000 mL | RESPIRATORY_TRACT | 1 refills | Status: AC | PRN

## 2017-04-18 MED ORDER — ALBUTEROL SULFATE HFA 108 (90 BASE) MCG/ACT IN AERS: 2.0000 | INHALATION_SPRAY | Freq: Four times a day (QID) | RESPIRATORY_TRACT | 2 refills | Status: AC | PRN

## 2017-04-18 MED ORDER — ASPIRIN 81 MG PO TBEC: 81.0000 mg | DELAYED_RELEASE_TABLET | Freq: Every day | ORAL | 2 refills | Status: AC

## 2017-04-18 MED ORDER — TEMAZEPAM 7.5 MG PO CAPS: 7.5000 mg | ORAL_CAPSULE | Freq: Every day | ORAL | 0 refills | Status: DC

## 2017-04-18 MED ORDER — AMIODARONE HCL 200 MG PO TABS: 200.0000 mg | ORAL_TABLET | Freq: Two times a day (BID) | ORAL | 1 refills | Status: AC

## 2017-04-18 MED ORDER — FUROSEMIDE 20 MG PO TABS: 20.0000 mg | ORAL_TABLET | Freq: Every day | ORAL | 1 refills | Status: AC | PRN

## 2017-04-18 MED ORDER — PREDNISONE 10 MG (21) PO TBPK: ORAL_TABLET | ORAL | 0 refills | Status: DC

## 2017-04-18 MED ORDER — DOXYCYCLINE HYCLATE 100 MG PO TABS: 100.0000 mg | ORAL_TABLET | Freq: Two times a day (BID) | ORAL | 0 refills | Status: DC

## 2017-04-18 MED ORDER — TRAMADOL HCL 50 MG PO TABS: 50.0000 mg | ORAL_TABLET | Freq: Four times a day (QID) | ORAL | 0 refills | Status: DC | PRN

## 2017-04-18 NOTE — Progress Notes (Signed)
Discharge instructions along with home medication list and follow up gone over with patient, patient verbalized that she understood instructions. Printed rx's given to patient. Personal 02 tank and nebulizer delivered to bedside. Iv removed x1, and telemetry removed. No distress noted. Patent to be discharged home.

## 2017-04-18 NOTE — Progress Notes (Signed)
SUBJECTIVE: Patient is feeling much better denies any chest pain or shortness of breath   Vitals:   04/17/17 2059 04/18/17 0447 04/18/17 0500 04/18/17 0815  BP: 138/76 122/70  (!) 154/95  Pulse: 96 83  82  Resp: 18 18  (!) 21  Temp: 98.5 F (36.9 C) 97.8 F (36.6 C)  (!) 97.5 F (36.4 C)  TempSrc: Oral Oral  Axillary  SpO2: 91% 94%  93%  Weight:   125 lb (56.7 kg)   Height:        Intake/Output Summary (Last 24 hours) at 04/18/17 1014 Last data filed at 04/18/17 0646  Gross per 24 hour  Intake              480 ml  Output             1301 ml  Net             -821 ml    LABS: Basic Metabolic Panel:  Recent Labs  40/98/1107/30/18 0345 04/17/17 0459 04/18/17 0403  NA 134* 140  --   K 3.8 3.5  --   CL 95* 98*  --   CO2 32 34*  --   GLUCOSE 303* 183*  --   BUN 16 20  --   CREATININE 0.70 0.75  --   CALCIUM 8.8* 8.8*  --   MG 2.4  --   --   PHOS 1.6* 2.3* 2.4*   Liver Function Tests: No results for input(s): AST, ALT, ALKPHOS, BILITOT, PROT, ALBUMIN in the last 72 hours. No results for input(s): LIPASE, AMYLASE in the last 72 hours. CBC:  Recent Labs  04/16/17 0345  WBC 8.7  HGB 14.9  HCT 44.5  MCV 103.1*  PLT 331   Cardiac Enzymes: No results for input(s): CKTOTAL, CKMB, CKMBINDEX, TROPONINI in the last 72 hours. BNP: Invalid input(s): POCBNP D-Dimer: No results for input(s): DDIMER in the last 72 hours. Hemoglobin A1C:  Recent Labs  04/16/17 0345  HGBA1C 5.2   Fasting Lipid Panel: No results for input(s): CHOL, HDL, LDLCALC, TRIG, CHOLHDL, LDLDIRECT in the last 72 hours. Thyroid Function Tests: No results for input(s): TSH, T4TOTAL, T3FREE, THYROIDAB in the last 72 hours.  Invalid input(s): FREET3 Anemia Panel: No results for input(s): VITAMINB12, FOLATE, FERRITIN, TIBC, IRON, RETICCTPCT in the last 72 hours.   PHYSICAL EXAM General: Well developed, well nourished, in no acute distress HEENT:  Normocephalic and atramatic Neck:  No JVD.  Lungs:  Clear bilaterally to auscultation and percussion. Heart: HRRR . Normal S1 and S2 without gallops or murmurs.  Abdomen: Bowel sounds are positive, abdomen soft and non-tender  Msk:  Back normal, normal gait. Normal strength and tone for age. Extremities: No clubbing, cyanosis or edema.   Neuro: Alert and oriented X 3. Psych:  Good affect, responds appropriately  TELEMETRY: Sinus rhythm  ASSESSMENT AND PLAN: Congestive heart failure with mildly elevated troponin and presumed sleep apnea. Clinically feeling much better denies any chest pain or shortness of breath remains in sinus rhythm. Cardiac point of view can be discharged with follow-up in the office 10:00 on Friday in my office.  Active Problems:   Acute CHF (congestive heart failure) (HCC)    KHAN,SHAUKAT A, MD, [facility] SystemFACC 04/18/2017 10:14 AM

## 2017-04-18 NOTE — Discharge Summary (Signed)
[facility] [place] [facility]   PATIENT NAME: Rose Torres    MR#:  [id number]  DATE OF BIRTH:  [birth year]  DATE OF ADMISSION:  04/14/2017   ADMITTING PHYSICIAN: Shaune Pollack, MD  DATE OF DISCHARGE:  04/18/2017  PRIMARY CARE PHYSICIAN: Patient, No Pcp Per   ADMISSION DIAGNOSIS:   Acute pulmonary edema (HCC) [J81.0] SOB (shortness of breath) [R06.02] Elevated troponin [R74.8]  DISCHARGE DIAGNOSIS:   Active Problems:   Acute CHF (congestive heart failure) (HCC)   SECONDARY DIAGNOSIS:   Past Medical History:  Diagnosis Date  . Alcohol abuse   . Bronchitis   . COPD (chronic obstructive pulmonary disease) (HCC)   . Shortness of breath dyspnea     HOSPITAL COURSE:   66 year old female with past medical history significant for alcohol abuse, COPD not currently on home oxygen admitted with shortness of breath  #1 Acute hypoxic respiratory failure- initially admitted for CHF exacerbation - also has COPD exacerbation, Initially required BiPAP, currently weaned down to nasal cannula. - Has 2 L home oxygen at home but patient hasn't been using it for the last 2 months. -Qualifying saturations recorded again, counseled patient that she needs home oxygen at this timew -On doxycycline for possible bronchitis. Treat for 5 days -Was on IV steroids here, being changed to prednisone taper at discharge  #2 CHF- acute diastolic CHF. -Echo with normal EF. LVH noted - alcohol induced cardiomyopathy -Appreciate cardiology consult. Continue Lasix when necessary at discharge -Blood pressure meds discontinued due to hypotension   #3 Alcohol abuse, no recent alc level or drug screen done - From may 2018- alc level significantly elevated Husband passed away 5 weeks ago counseled prior to discharge  #4 Elevated troponin- demand ischemia, stable troponins - ECHO with normal ejection fraction, cards consulted -SVT episode in the hospital with prolonged QT,  started on amiodarone. Currently on oral amiodarone. Follow up with cardiology as outpatient  #5 bilateral knee pain-worse on the right side, subacute on chronic pain. X-rays with minimal arthritis in past admission. -Outpatient orthopedic referral. If needed will need an injection on the medial side of each knee for bursitis  Physical Therapy consulted-they have recommended rehabilitation Discharge today  DISCHARGE CONDITIONS:   Guarded CONSULTS OBTAINED:   Treatment Team:  Laurier Nancy, MD  DRUG ALLERGIES:   No Known Allergies DISCHARGE MEDICATIONS:   Allergies as of 04/18/2017   No Known Allergies     Medication List    STOP taking these medications   beclomethasone 80 MCG/ACT inhaler Commonly known as:  QVAR   budesonide 0.25 MG/2ML nebulizer solution Commonly known as:  PULMICORT   ibuprofen 200 MG tablet Commonly known as:  ADVIL,MOTRIN   nicotine 21 mg/24hr patch Commonly known as:  NICODERM CQ - dosed in mg/24 hours   predniSONE 20 MG tablet Commonly known as:  DELTASONE Replaced by:  predniSONE 10 MG (21) Tbpk tablet     TAKE these medications   acetaminophen 325 MG tablet Commonly known as:  TYLENOL Take 2 tablets (650 mg total) by mouth every 6 (six) hours as needed for mild pain (or Fever >/= 101).   albuterol 108 (90 Base) MCG/ACT inhaler Commonly known as:  PROVENTIL HFA;VENTOLIN HFA Inhale 2 puffs into the lungs every 6 (six) hours as needed for wheezing or shortness of breath. What changed:  how much to take   amiodarone 200 MG tablet Commonly known as:  PACERONE Take 1 tablet (200 mg total) by mouth  2 (two) times daily.   aspirin 81 MG EC tablet Take 1 tablet (81 mg total) by mouth daily.   budesonide-formoterol 160-4.5 MCG/ACT inhaler Commonly known as:  SYMBICORT Inhale 2 puffs into the lungs 2 (two) times daily.   doxycycline 100 MG tablet Commonly known as:  VIBRA-TABS Take 1 tablet (100 mg total) by mouth every 12 (twelve)  hours. X 4 more days   furosemide 20 MG tablet Commonly known as:  LASIX Take 1 tablet (20 mg total) by mouth daily as needed for fluid or edema.   ipratropium-albuterol 0.5-2.5 (3) MG/3ML Soln Commonly known as:  DUONEB Take 3 mLs by nebulization every 4 (four) hours as needed.   predniSONE 10 MG (21) Tbpk tablet Commonly known as:  STERAPRED UNI-PAK 21 TAB 6 tabs PO x 1 day 5 tabs PO x 1 day 4 tabs PO x 1 day 3 tabs PO x 1 day 2 tabs PO x 1 day 1 tab PO x 1 day and stop Replaces:  predniSONE 20 MG tablet   temazepam 7.5 MG capsule Commonly known as:  RESTORIL Take 1 capsule (7.5 mg total) by mouth at bedtime.   traMADol 50 MG tablet Commonly known as:  ULTRAM Take 1 tablet (50 mg total) by mouth every 6 (six) hours as needed.            Durable Medical Equipment        Start     Ordered   04/18/17 1105  For home use only DME Nebulizer machine  Once    Question:  Patient needs a nebulizer to treat with the following condition  Answer:  COPD (chronic obstructive pulmonary disease) (HCC)   04/18/17 1105   04/18/17 1105  For home use only DME oxygen  Once    Question Answer Comment  Mode or (Route) Nasal cannula   Liters per Minute 2   Frequency Continuous (stationary and portable oxygen unit needed)   Oxygen conserving device Yes   Oxygen delivery system Gas      04/18/17 1105       DISCHARGE INSTRUCTIONS:   1. PCP follow-up in 1-2 weeks  DIET:   Cardiac diet  ACTIVITY:   Activity as tolerated  OXYGEN:   Home Oxygen: Yes.    Oxygen Delivery: 2 liters/min via Patient connected to nasal cannula oxygen  DISCHARGE LOCATION:   home   If you experience worsening of your admission symptoms, develop shortness of breath, life threatening emergency, suicidal or homicidal thoughts you must seek medical attention immediately by calling 911 or calling your MD immediately  if symptoms less severe.  You Must read complete instructions/literature along with all  the possible adverse reactions/side effects for all the Medicines you take and that have been prescribed to you. Take any new Medicines after you have completely understood and accpet all the possible adverse reactions/side effects.   Please note  You were cared for by a hospitalist during your hospital stay. If you have any questions about your discharge medications or the care you received while you were in the hospital after you are discharged, you can call the unit and asked to speak with the hospitalist on call if the hospitalist that took care of you is not available. Once you are discharged, your primary care physician will handle any further medical issues. Please note that NO REFILLS for any discharge medications will be authorized once you are discharged, as it is imperative that you return to your primary care  physician (or establish a relationship with a primary care physician if you do not have one) for your aftercare needs so that they can reassess your need for medications and monitor your lab values.    On the day of Discharge:  VITAL SIGNS:   Blood pressure (!) 154/95, pulse 82, temperature (!) 97.5 F (36.4 C), temperature source Axillary, resp. rate (!) 21, height 5\' 2"  (1.575 m), weight 56.7 kg (125 lb), SpO2 93 %.  PHYSICAL EXAMINATION:    GENERAL:  66 y.o.-year-old well nourished patient lying in the bed, appears well, not in any acute distress.  EYES: Pupils equal, round, reactive to light and accommodation. No scleral icterus. Extraocular muscles intact.  HEENT: Head atraumatic, normocephalic. Oropharynx and nasopharynx clear.  NECK:  Supple, no jugular venous distention. No thyroid enlargement, no tenderness.  LUNGS: Moving air bilaterally, improved wheezing.. Not using accessory muscles to breathe with minimal exertion. No rales or rhonchi heard.  CARDIOVASCULAR: S1, S2 normal. No murmurs, rubs, or gallops.  ABDOMEN: Soft, nontender, nondistended. Bowel sounds  present. No organomegaly or mass.  EXTREMITIES: No pedal edema, cyanosis, or clubbing.  NEUROLOGIC: Cranial nerves II through XII are intact. Muscle strength 5/5 in all extremities. Sensation intact. Gait not checked. Following commands PSYCHIATRIC: The patient is alert and oriented x 3, SKIN: No obvious rash, lesion, or ulcer.    DATA REVIEW:   CBC  Recent Labs Lab 04/16/17 0345  WBC 8.7  HGB 14.9  HCT 44.5  PLT 331    Chemistries   Recent Labs Lab 04/14/17 1522  04/16/17 0345 04/17/17 0459  NA 135  < > 134* 140  K 4.7  < > 3.8 3.5  CL 103  < > 95* 98*  CO2 25  < > 32 34*  GLUCOSE 124*  < > 303* 183*  BUN 9  < > 16 20  CREATININE 0.48  < > 0.70 0.75  CALCIUM 8.6*  < > 8.8* 8.8*  MG  --   < > 2.4  --   AST 24  --   --   --   ALT 13*  --   --   --   ALKPHOS 140*  --   --   --   BILITOT 0.5  --   --   --   < > = values in this interval not displayed.   Microbiology Results  Results for orders placed or performed during the hospital encounter of 04/14/17  MRSA PCR Screening     Status: None   Collection Time: 04/15/17  9:01 AM  Result Value Ref Range Status   MRSA by PCR NEGATIVE NEGATIVE Final    Comment:        The GeneXpert MRSA Assay (FDA approved for NASAL specimens only), is one component of a comprehensive MRSA colonization surveillance program. It is not intended to diagnose MRSA infection nor to guide or monitor treatment for MRSA infections.     RADIOLOGY:  No results found.   Management plans discussed with the patient, family and they are in agreement.  CODE STATUS:     Code Status Orders        Start     Ordered   04/14/17 1856  Full code  Continuous     04/14/17 1855    Code Status History    Date Active Date Inactive Code Status Order ID Comments User Context   01/19/2017 11:07 PM 01/22/2017  5:36 PM Full Code [id number]  Oralia Manis,  MD ED   12/06/2015  1:33 PM 12/07/2015  7:03 PM Full Code [id number]  Alford HighlandWieting, Richard, MD ED        TOTAL TIME TAKING CARE OF THIS PATIENT: 37 minutes.    Woodley Petzold M.D on 04/18/2017 at 11:05 AM  Between 7am to 6pm - Pager - [phone]  After 6pm go to [email or web address] - Social research officer, governmentpassword [email or web address]  [facility] [place] [facility]  Office  [phone]  CC: Primary care physician; Patient, No Pcp Per   Note: This dictation was prepared with Dragon dictation along with smaller phrase technology. Any transcriptional errors that result from this process are unintentional.

## 2017-04-18 NOTE — Care Management (Signed)
Patient verbally confirms to CM that she does not have home nebulizer machine.  [facility] will provide portable oxygen tank and home neb machine.  Discharge [facility]s [facility] and instructed patient on picking them up at discharge. She has transportation home.  Provided her with scales.  Discussed the need to continue to review information in Living with Heart Failure Education Booklet; contacting [facility] questions, keeping the appointments at the [facility] and [facility].  She also states when asked that she has written information on etoh resources. Informed her that [facility] will not be able to fill pain meds or sleep med

## 2017-04-25 ENCOUNTER — Ambulatory Visit: Payer: Self-pay | Admitting: Family

## 2017-05-01 ENCOUNTER — Ambulatory Visit: Payer: Self-pay | Admitting: Family

## 2017-05-02 ENCOUNTER — Telehealth: Payer: Self-pay | Admitting: Family

## 2017-05-02 ENCOUNTER — Ambulatory Visit: Payer: Self-pay | Admitting: Family

## 2017-05-02 NOTE — Telephone Encounter (Signed)
Patient missed her initial appointment at the [facility] on 05/02/17. Will attempt to reschedule.

## 2017-05-03 ENCOUNTER — Ambulatory Visit: Payer: Self-pay

## 2017-05-04 NOTE — Care Management (Signed)
Patient did not keep her appointment with [facility] or the [facility] 8/15 and 8/16

## 2017-05-22 ENCOUNTER — Inpatient Hospital Stay: Payer: Self-pay | Admitting: Pulmonary Disease

## 2017-05-23 ENCOUNTER — Encounter: Payer: Self-pay | Admitting: Pulmonary Disease

## 2017-06-11 ENCOUNTER — Emergency Department: Payer: Medicaid Other

## 2017-06-11 ENCOUNTER — Encounter: Payer: Self-pay | Admitting: Emergency Medicine

## 2017-06-11 ENCOUNTER — Inpatient Hospital Stay
Admission: EM | Admit: 2017-06-11 | Discharge: 2017-06-15 | DRG: 190 | Disposition: A | Discharge status: Home / Self Care | Payer: Medicaid Other | Attending: Internal Medicine | Admitting: Internal Medicine

## 2017-06-11 DIAGNOSIS — F17210 Nicotine dependence, cigarettes, uncomplicated: Secondary | ICD-10-CM | POA: Diagnosis present

## 2017-06-11 DIAGNOSIS — F419 Anxiety disorder, unspecified: Secondary | ICD-10-CM | POA: Diagnosis present

## 2017-06-11 DIAGNOSIS — Z79899 Other long term (current) drug therapy: Secondary | ICD-10-CM | POA: Diagnosis not present

## 2017-06-11 DIAGNOSIS — Z7982 Long term (current) use of aspirin: Secondary | ICD-10-CM | POA: Diagnosis not present

## 2017-06-11 DIAGNOSIS — F331 Major depressive disorder, recurrent, moderate: Secondary | ICD-10-CM | POA: Diagnosis present

## 2017-06-11 DIAGNOSIS — J9602 Acute respiratory failure with hypercapnia: Secondary | ICD-10-CM | POA: Diagnosis present

## 2017-06-11 DIAGNOSIS — Z7951 Long term (current) use of inhaled steroids: Secondary | ICD-10-CM

## 2017-06-11 DIAGNOSIS — R0902 Hypoxemia: Secondary | ICD-10-CM

## 2017-06-11 DIAGNOSIS — F1010 Alcohol abuse, uncomplicated: Secondary | ICD-10-CM | POA: Diagnosis present

## 2017-06-11 DIAGNOSIS — Z90710 Acquired absence of both cervix and uterus: Secondary | ICD-10-CM | POA: Diagnosis not present

## 2017-06-11 DIAGNOSIS — Z79891 Long term (current) use of opiate analgesic: Secondary | ICD-10-CM

## 2017-06-11 DIAGNOSIS — Z716 Tobacco abuse counseling: Secondary | ICD-10-CM | POA: Diagnosis not present

## 2017-06-11 DIAGNOSIS — Z818 Family history of other mental and behavioral disorders: Secondary | ICD-10-CM

## 2017-06-11 DIAGNOSIS — E876 Hypokalemia: Secondary | ICD-10-CM | POA: Diagnosis present

## 2017-06-11 DIAGNOSIS — I5033 Acute on chronic diastolic (congestive) heart failure: Secondary | ICD-10-CM | POA: Diagnosis present

## 2017-06-11 DIAGNOSIS — J9601 Acute respiratory failure with hypoxia: Secondary | ICD-10-CM | POA: Diagnosis present

## 2017-06-11 DIAGNOSIS — R0602 Shortness of breath: Secondary | ICD-10-CM

## 2017-06-11 DIAGNOSIS — J441 Chronic obstructive pulmonary disease with (acute) exacerbation: Principal | ICD-10-CM | POA: Diagnosis present

## 2017-06-11 DIAGNOSIS — Z808 Family history of malignant neoplasm of other organs or systems: Secondary | ICD-10-CM | POA: Diagnosis not present

## 2017-06-11 HISTORY — DX: Unspecified diastolic (congestive) heart failure: I50.30

## 2017-06-11 LAB — BRAIN NATRIURETIC PEPTIDE: B Natriuretic Peptide: 922 pg/mL — ABNORMAL HIGH (ref 0.0–100.0)

## 2017-06-11 LAB — URINALYSIS, COMPLETE (UACMP) WITH MICROSCOPIC
Bacteria, UA: NONE SEEN
Bilirubin Urine: NEGATIVE
GLUCOSE, UA: NEGATIVE mg/dL
Hgb urine dipstick: NEGATIVE
KETONES UR: NEGATIVE mg/dL
Leukocytes, UA: NEGATIVE
NITRITE: NEGATIVE
Protein, ur: NEGATIVE mg/dL
Specific Gravity, Urine: 1.015 (ref 1.005–1.030)
pH: 5 (ref 5.0–8.0)

## 2017-06-11 LAB — CBC
HCT: 51.4 % — ABNORMAL HIGH (ref 35.0–47.0)
HEMOGLOBIN: 17.3 g/dL — AB (ref 12.0–16.0)
MCH: 34.2 pg — AB (ref 26.0–34.0)
MCHC: 33.7 g/dL (ref 32.0–36.0)
MCV: 101.7 fL — AB (ref 80.0–100.0)
Platelets: 309 10*3/uL (ref 150–440)
RBC: 5.06 MIL/uL (ref 3.80–5.20)
RDW: 17 % — ABNORMAL HIGH (ref 11.5–14.5)
WBC: 7.9 10*3/uL (ref 3.6–11.0)

## 2017-06-11 LAB — BASIC METABOLIC PANEL
ANION GAP: 14 (ref 5–15)
BUN: <5 mg/dL — ABNORMAL LOW (ref 6–20)
CHLORIDE: 99 mmol/L — AB (ref 101–111)
CO2: 31 mmol/L (ref 22–32)
Calcium: 8.2 mg/dL — ABNORMAL LOW (ref 8.9–10.3)
Creatinine, Ser: 0.62 mg/dL (ref 0.44–1.00)
GFR calc non Af Amer: >60 mL/min (ref >60)
Glucose, Bld: 152 mg/dL — ABNORMAL HIGH (ref 65–99)
Potassium: 3.1 mmol/L — ABNORMAL LOW (ref 3.5–5.1)
Sodium: 144 mmol/L (ref 135–145)

## 2017-06-11 LAB — ETHANOL: ALCOHOL ETHYL (B): 176 mg/dL — AB (ref <5)

## 2017-06-11 LAB — TROPONIN I

## 2017-06-11 LAB — GLUCOSE, CAPILLARY: GLUCOSE-CAPILLARY: 133 mg/dL — AB (ref 65–99)

## 2017-06-11 LAB — MRSA PCR SCREENING: MRSA BY PCR: NEGATIVE

## 2017-06-11 MED ORDER — INFLUENZA VAC SPLIT QUAD 0.5 ML IM SUSY: 0.5000 mL | PREFILLED_SYRINGE | INTRAMUSCULAR | Status: DC

## 2017-06-11 MED ORDER — METHYLPREDNISOLONE SODIUM SUCC 125 MG IJ SOLR
125.0000 mg | Freq: Once | INTRAMUSCULAR | Status: AC
  Administered 2017-06-11: 125 mg via INTRAVENOUS
  Filled 2017-06-11: qty 2

## 2017-06-11 MED ORDER — ACETAMINOPHEN 325 MG PO TABS
650.0000 mg | ORAL_TABLET | Freq: Four times a day (QID) | ORAL | Status: DC | PRN
  Administered 2017-06-13: 650 mg via ORAL
  Filled 2017-06-11 (×2): qty 2

## 2017-06-11 MED ORDER — ONDANSETRON HCL 4 MG/2ML IJ SOLN: 4.0000 mg | Freq: Four times a day (QID) | INTRAMUSCULAR | Status: DC | PRN

## 2017-06-11 MED ORDER — AZITHROMYCIN 500 MG PO TABS
500.0000 mg | ORAL_TABLET | Freq: Every day | ORAL | Status: AC
  Administered 2017-06-12 – 2017-06-14 (×3): 500 mg via ORAL
  Filled 2017-06-11 (×4): qty 1

## 2017-06-11 MED ORDER — AMIODARONE HCL 200 MG PO TABS
200.0000 mg | ORAL_TABLET | Freq: Two times a day (BID) | ORAL | Status: DC
  Administered 2017-06-11 – 2017-06-15 (×8): 200 mg via ORAL
  Filled 2017-06-11 (×8): qty 1

## 2017-06-11 MED ORDER — NICOTINE 7 MG/24HR TD PT24
7.0000 mg | MEDICATED_PATCH | Freq: Every day | TRANSDERMAL | Status: DC
  Administered 2017-06-12 – 2017-06-15 (×4): 7 mg via TRANSDERMAL
  Filled 2017-06-11 (×5): qty 1

## 2017-06-11 MED ORDER — ACETAMINOPHEN 325 MG PO TABS
650.0000 mg | ORAL_TABLET | Freq: Four times a day (QID) | ORAL | Status: DC | PRN
  Administered 2017-06-12 – 2017-06-14 (×2): 650 mg via ORAL
  Filled 2017-06-11: qty 2

## 2017-06-11 MED ORDER — METHYLPREDNISOLONE SODIUM SUCC 125 MG IJ SOLR
60.0000 mg | Freq: Four times a day (QID) | INTRAMUSCULAR | Status: DC
  Administered 2017-06-12 (×3): 60 mg via INTRAVENOUS
  Filled 2017-06-11 (×3): qty 2

## 2017-06-11 MED ORDER — ENOXAPARIN SODIUM 40 MG/0.4ML ~~LOC~~ SOLN
40.0000 mg | SUBCUTANEOUS | Status: DC
  Administered 2017-06-11 – 2017-06-14 (×4): 40 mg via SUBCUTANEOUS
  Filled 2017-06-11 (×4): qty 0.4

## 2017-06-11 MED ORDER — POTASSIUM CHLORIDE 10 MEQ/100ML IV SOLN
10.0000 meq | INTRAVENOUS | Status: AC
  Administered 2017-06-11 – 2017-06-12 (×3): 10 meq via INTRAVENOUS
  Filled 2017-06-11 (×3): qty 100

## 2017-06-11 MED ORDER — SODIUM CHLORIDE 0.9 % IV BOLUS (SEPSIS)
1000.0000 mL | Freq: Once | INTRAVENOUS | Status: AC
  Administered 2017-06-11: 1000 mL via INTRAVENOUS

## 2017-06-11 MED ORDER — ACETAMINOPHEN 650 MG RE SUPP: 650.0000 mg | Freq: Four times a day (QID) | RECTAL | Status: DC | PRN

## 2017-06-11 MED ORDER — IPRATROPIUM-ALBUTEROL 0.5-2.5 (3) MG/3ML IN SOLN: 3.0000 mL | RESPIRATORY_TRACT | Status: DC | PRN

## 2017-06-11 MED ORDER — MOMETASONE FURO-FORMOTEROL FUM 200-5 MCG/ACT IN AERO
2.0000 | INHALATION_SPRAY | Freq: Two times a day (BID) | RESPIRATORY_TRACT | Status: DC
  Administered 2017-06-11 – 2017-06-12 (×3): 2 via RESPIRATORY_TRACT
  Filled 2017-06-11: qty 8.8

## 2017-06-11 MED ORDER — FUROSEMIDE 20 MG PO TABS: 20.0000 mg | ORAL_TABLET | Freq: Every day | ORAL | Status: DC | PRN

## 2017-06-11 MED ORDER — ASPIRIN EC 81 MG PO TBEC
81.0000 mg | DELAYED_RELEASE_TABLET | Freq: Every day | ORAL | Status: DC
  Administered 2017-06-12 – 2017-06-15 (×4): 81 mg via ORAL
  Filled 2017-06-11 (×4): qty 1

## 2017-06-11 MED ORDER — ONDANSETRON HCL 4 MG PO TABS: 4.0000 mg | ORAL_TABLET | Freq: Four times a day (QID) | ORAL | Status: DC | PRN

## 2017-06-11 MED ORDER — IPRATROPIUM-ALBUTEROL 0.5-2.5 (3) MG/3ML IN SOLN
3.0000 mL | Freq: Once | RESPIRATORY_TRACT | Status: AC
  Administered 2017-06-11: 3 mL via RESPIRATORY_TRACT
  Filled 2017-06-11: qty 3

## 2017-06-11 MED ORDER — TRAMADOL HCL 50 MG PO TABS
50.0000 mg | ORAL_TABLET | Freq: Four times a day (QID) | ORAL | Status: DC | PRN
  Administered 2017-06-11 – 2017-06-14 (×7): 50 mg via ORAL
  Filled 2017-06-11 (×7): qty 1

## 2017-06-11 MED ORDER — ALBUTEROL SULFATE (2.5 MG/3ML) 0.083% IN NEBU
5.0000 mg | INHALATION_SOLUTION | Freq: Once | RESPIRATORY_TRACT | Status: AC
  Administered 2017-06-11: 5 mg via RESPIRATORY_TRACT
  Filled 2017-06-11: qty 6

## 2017-06-11 NOTE — Consult Note (Signed)
Name: Rose Torres MRN: [id number] DOB: May 17, 1951    ADMISSION DATE:  06/11/2017  CONSULTATION DATE:  06/11/17  REFERRING MD : Dr. Anne Hahn  CHIEF COMPLAINT:  Shortness of breath  BRIEF PATIENT DESCRIPTION: 66 year old female with with AECOPD  SIGNIFICANT EVENTS  9/24 Patient admitted to the SDU with COPD Exacerbation requiring BiPAP  STUDIES:  04/15/17 ECHO>>Left ventricle: The cavity size wasnormal.Therewasmoderate  concentric hypertrophy. Systolic function was normal. The  estimated ejection fraction was 60%   HISTORY OF PRESENT ILLNESS: Rose Torres is a 66 year old female with known history of COPD,CHF, alcohol  And Tobacco abuse.  Patient presented to ED on 9/24 with increased shortness of breath and decreased mental status.  Patient was placed on BiPAP and transferred to the SDU for further management.  PAST MEDICAL HISTORY :   has a past medical history of Alcohol abuse; Bronchitis; COPD (chronic obstructive pulmonary disease) (HCC); Diastolic CHF (HCC); and Shortness of breath dyspnea.  has a past surgical history that includes Abdominal hysterectomy and Cyst removal hand. Prior to Admission medications   Medication Sig Start Date End Date Taking? Authorizing Provider  acetaminophen (TYLENOL) 325 MG tablet Take 2 tablets (650 mg total) by mouth every 6 (six) hours as needed for mild pain (or Fever >/= 101). 01/22/17   Adrian Saran, MD  albuterol (PROVENTIL HFA;VENTOLIN HFA) 108 (90 Base) MCG/ACT inhaler Inhale 2 puffs into the lungs every 6 (six) hours as needed for wheezing or shortness of breath. 04/18/17   Enid Baas, MD  amiodarone (PACERONE) 200 MG tablet Take 1 tablet (200 mg total) by mouth 2 (two) times daily. 04/18/17   Enid Baas, MD  aspirin EC 81 MG EC tablet Take 1 tablet (81 mg total) by mouth daily. 04/19/17   Enid Baas, MD  budesonide-formoterol [facility]) 160-4.5 MCG/ACT inhaler Inhale 2 puffs into the lungs 2 (two) times daily.  04/18/17   Enid Baas, MD  doxycycline (VIBRA-TABS) 100 MG tablet Take 1 tablet (100 mg total) by mouth every 12 (twelve) hours. X 4 more days 04/18/17   Enid Baas, MD  furosemide (LASIX) 20 MG tablet Take 1 tablet (20 mg total) by mouth daily as needed for fluid or edema. 04/18/17 04/18/18  Enid Baas, MD  ipratropium-albuterol (DUONEB) 0.5-2.5 (3) MG/3ML SOLN Take 3 mLs by nebulization every 4 (four) hours as needed. 04/18/17   Enid Baas, MD  predniSONE (STERAPRED UNI-PAK 21 TAB) 10 MG (21) TBPK tablet 6 tabs PO x 1 day 5 tabs PO x 1 day 4 tabs PO x 1 day 3 tabs PO x 1 day 2 tabs PO x 1 day 1 tab PO x 1 day and stop 04/18/17   Enid Baas, MD  temazepam (RESTORIL) 7.5 MG capsule Take 1 capsule (7.5 mg total) by mouth at bedtime. 04/18/17   Enid Baas, MD  traMADol (ULTRAM) 50 MG tablet Take 1 tablet (50 mg total) by mouth every 6 (six) hours as needed. 04/18/17 04/18/18  Enid Baas, MD   No Known Allergies  FAMILY HISTORY:  family history includes Healthy in her mother; Melanoma in her father. SOCIAL HISTORY:  reports that she has been smoking.  She has been smoking about 0.50 packs per day. She uses smokeless tobacco. She reports that she does not drink alcohol or use drugs.  REVIEW OF SYSTEMS:   Constitutional: Negative for fever, chills, weight loss, malaise/fatigue and diaphoresis.  HENT: Negative for hearing loss, ear pain, nosebleeds, congestion, sore throat, neck pain, tinnitus  and ear discharge.   Eyes: Negative for blurred vision, double vision, photophobia, pain, discharge and redness.  Respiratory: Negative for cough, hemoptysis, sputum production, shortness of breath, wheezing and stridor.   Cardiovascular: Negative for chest pain, palpitations, orthopnea, claudication, leg swelling and PND.  Gastrointestinal: Negative for heartburn, nausea, vomiting, abdominal pain, diarrhea, constipation, blood in stool and melena.  Genitourinary:  Negative for dysuria, urgency, frequency, hematuria and flank pain.  Musculoskeletal: Negative for myalgias, back pain, joint pain and falls.  Skin: Negative for itching and rash.  Neurological: Negative for dizziness, tingling, tremors, sensory change, speech change, focal weakness, seizures, loss of consciousness, weakness and headaches.  Endo/Heme/Allergies: Negative for environmental allergies and polydipsia. Does not bruise/bleed easily.  SUBJECTIVE: Patient states" that she has been feeling week all over"  VITAL SIGNS: Temp:  [98.2 F (36.8 C)] 98.2 F (36.8 C) (09/24 1850) Pulse Rate:  [95-113] 95 (09/24 2136) Resp:  [18-26] 18 (09/24 2136) BP: (85-106)/(53-72) 94/56 (09/24 2136) SpO2:  [88 %-97 %] 96 % (09/24 2136) Weight:  [54.4 kg (120 lb)] 54.4 kg (120 lb) (09/24 1853)  PHYSICAL EXAMINATION: General:  66 year old female,on Bipap, in no acute distress Neuro: Awake,alert and oriented HEENT:  AT,[place],No jvd Cardiovascular:  S1s2,regular,no m/r/g Lungs: diminished bilaterally, no wheezes,crackles and rhonchi  Abdomen: soft,NT,ND,+BS Musculoskeletal:  No edema,cyanosis Skin: warm,dry and intact   Recent Labs Lab 06/11/17 1855  NA 144  K 3.1*  CL 99*  CO2 31  BUN <5*  CREATININE 0.62  GLUCOSE 152*    Recent Labs Lab 06/11/17 1855  HGB 17.3*  HCT 51.4*  WBC 7.9  PLT 309   Dg Chest 2 View  Result Date: 06/11/2017 CLINICAL DATA:  Patient with shortness of breath for 2 days. EXAM: CHEST  2 VIEW COMPARISON:  Chest radiograph 04/15/2017. FINDINGS: Multiple monitoring leads overlie the patient. Stable cardiomegaly. Interval decrease in size of small left pleural effusion. Pulmonary vascular redistribution and mild interstitial opacities. No large area of pulmonary consolidation. Thoracic spine degenerative changes. No pneumothorax. IMPRESSION: Interval decrease in size of small left pleural effusion with underlying opacities favored to represent atelectasis.  Cardiomegaly. Pulmonary vascular redistribution and mild interstitial edema. Electronically Signed   By: Annia Belt M.D.   On: 06/11/2017 19:24    ASSESSMENT / PLAN: Acute on Chronic Respiratory failure secondary to COPD exacerbation CHF Exacerbation Tobacco Abuse Alcohol Abuse Hypokalemia   Continue BiPAP,Wean as tolerated Azitromycin  x5 days Bronchodilators Continue Steroids,taper Nicotine Patch Replace K Continue Amiodarone Continue Aspirin Lasix as needed   Rose Torres,AG-ACNP Pulmonary and Critical Care Medicine [facility]   06/11/2017, 10:21 PM

## 2017-06-11 NOTE — H&P (Signed)
[facility] [place] at [facility]   PATIENT NAME: Rose Torres    MR#:  [id number]  DATE OF BIRTH:  02/07/1951  DATE OF ADMISSION:  06/11/2017  PRIMARY CARE PHYSICIAN: Patient, No Pcp Per   REQUESTING/REFERRING PHYSICIAN: Siadecki, MD  CHIEF COMPLAINT:   Chief Complaint  Patient presents with  . Shortness of Breath    HISTORY OF PRESENT ILLNESS:  Rose Torres  is a 66 y.o. female who presents with significant shortness of breath and decreased mental status. Patient has a history of COPD and CHF, on workup here in the ED seems that she is in more COPD exacerbation. She required BiPAP in the ED due to significant hypercapnia, as well as some mild hypoxia. hospitalists were called for admission  PAST MEDICAL HISTORY:   Past Medical History:  Diagnosis Date  . Alcohol abuse   . Bronchitis   . COPD (chronic obstructive pulmonary disease) (HCC)   . Diastolic CHF (HCC)   . Shortness of breath dyspnea     PAST SURGICAL HISTORY:   Past Surgical History:  Procedure Laterality Date  . ABDOMINAL HYSTERECTOMY    . CYST REMOVAL HAND      SOCIAL HISTORY:   Social History  Substance Use Topics  . Smoking status: Current Every Day Smoker    Packs/day: 0.50  . Smokeless tobacco: Current User  . Alcohol use No    FAMILY HISTORY:   Family History  Problem Relation Age of Onset  . Healthy Mother   . Melanoma Father     DRUG ALLERGIES:  No Known Allergies  MEDICATIONS AT HOME:   Prior to Admission medications   Medication Sig Start Date End Date Taking? Authorizing Provider  acetaminophen (TYLENOL) 325 MG tablet Take 2 tablets (650 mg total) by mouth every 6 (six) hours as needed for mild pain (or Fever >/= 101). 01/22/17   Adrian Saran, MD  albuterol (PROVENTIL HFA;VENTOLIN HFA) 108 (90 Base) MCG/ACT inhaler Inhale 2 puffs into the lungs every 6 (six) hours as needed for wheezing or shortness of breath. 04/18/17   Enid Baas, MD   amiodarone (PACERONE) 200 MG tablet Take 1 tablet (200 mg total) by mouth 2 (two) times daily. 04/18/17   Enid Baas, MD  aspirin EC 81 MG EC tablet Take 1 tablet (81 mg total) by mouth daily. 04/19/17   Enid Baas, MD  budesonide-formoterol [facility]) 160-4.5 MCG/ACT inhaler Inhale 2 puffs into the lungs 2 (two) times daily. 04/18/17   Enid Baas, MD  doxycycline (VIBRA-TABS) 100 MG tablet Take 1 tablet (100 mg total) by mouth every 12 (twelve) hours. X 4 more days 04/18/17   Enid Baas, MD  furosemide (LASIX) 20 MG tablet Take 1 tablet (20 mg total) by mouth daily as needed for fluid or edema. 04/18/17 04/18/18  Enid Baas, MD  ipratropium-albuterol (DUONEB) 0.5-2.5 (3) MG/3ML SOLN Take 3 mLs by nebulization every 4 (four) hours as needed. 04/18/17   Enid Baas, MD  predniSONE (STERAPRED UNI-PAK 21 TAB) 10 MG (21) TBPK tablet 6 tabs PO x 1 day 5 tabs PO x 1 day 4 tabs PO x 1 day 3 tabs PO x 1 day 2 tabs PO x 1 day 1 tab PO x 1 day and stop 04/18/17   Enid Baas, MD  temazepam (RESTORIL) 7.5 MG capsule Take 1 capsule (7.5 mg total) by mouth at bedtime. 04/18/17   Enid Baas, MD  traMADol (ULTRAM) 50 MG tablet Take 1 tablet (50 mg total)  by mouth every 6 (six) hours as needed. 04/18/17 04/18/18  Enid Baas, MD    REVIEW OF SYSTEMS:  Review of Systems  Constitutional: Negative for chills, fever, malaise/fatigue and weight loss.  HENT: Negative for ear pain, hearing loss and tinnitus.   Eyes: Negative for blurred vision, double vision, pain and redness.  Respiratory: Positive for shortness of breath and wheezing. Negative for cough and hemoptysis.   Cardiovascular: Negative for chest pain, palpitations, orthopnea and leg swelling.  Gastrointestinal: Negative for abdominal pain, constipation, diarrhea, nausea and vomiting.  Genitourinary: Negative for dysuria, frequency and hematuria.  Musculoskeletal: Negative for back pain, joint pain and  neck pain.  Skin:       No acne, rash, or lesions  Neurological: Negative for dizziness, tremors, focal weakness and weakness.  Endo/Heme/Allergies: Negative for polydipsia. Does not bruise/bleed easily.  Psychiatric/Behavioral: Negative for depression. The patient is not nervous/anxious and does not have insomnia.      VITAL SIGNS:   Vitals:   06/11/17 1930 06/11/17 1956 06/11/17 2000 06/11/17 2030  BP: 104/72  (!) 104/58 (!) 85/58  Pulse: (!) 108 (!) 113 (!) 110 (!) 108  Resp: (!) 21  (!) 24 20  Temp:      TempSrc:      SpO2: 96% (!) 88% 93% 93%  Weight:      Height:       Wt Readings from Last 3 Encounters:  06/11/17 54.4 kg (120 lb)  04/18/17 56.7 kg (125 lb)  01/19/17 55.7 kg (122 lb 12.8 oz)    PHYSICAL EXAMINATION:  Physical Exam  Vitals reviewed. Constitutional: She appears well-developed and well-nourished. No distress.  HENT:  Head: Normocephalic and atraumatic.  Mouth/Throat: Oropharynx is clear and moist.  Eyes: Pupils are equal, round, and reactive to light. Conjunctivae and EOM are normal. No scleral icterus.  Neck: Normal range of motion. Neck supple. No JVD present. No thyromegaly present.  Cardiovascular: Normal rate, regular rhythm and intact distal pulses.  Exam reveals no gallop and no friction rub.   No murmur heard. Respiratory: She is in respiratory distress. She has wheezes. She has no rales.  GI: Soft. Bowel sounds are normal. She exhibits no distension. There is no tenderness.  Musculoskeletal: Normal range of motion. She exhibits no edema.  No arthritis, no gout  Lymphadenopathy:    She has no cervical adenopathy.  Neurological: She is alert. No cranial nerve deficit.  Patient is arousable, can answer questions and interact, but is lethargic  Skin: Skin is warm and dry. No rash noted. No erythema.  Psychiatric:  Unable to fully assess due to patient condition    LABORATORY PANEL:   CBC  Recent Labs Lab 06/11/17 1855  WBC 7.9  HGB  17.3*  HCT 51.4*  PLT 309   ------------------------------------------------------------------------------------------------------------------  Chemistries   Recent Labs Lab 06/11/17 1855  NA 144  K 3.1*  CL 99*  CO2 31  GLUCOSE 152*  BUN <5*  CREATININE 0.62  CALCIUM 8.2*   ------------------------------------------------------------------------------------------------------------------  Cardiac Enzymes  Recent Labs Lab 06/11/17 1855  TROPONINI <0.03   ------------------------------------------------------------------------------------------------------------------  RADIOLOGY:  Dg Chest 2 View  Result Date: 06/11/2017 CLINICAL DATA:  Patient with shortness of breath for 2 days. EXAM: CHEST  2 VIEW COMPARISON:  Chest radiograph 04/15/2017. FINDINGS: Multiple monitoring leads overlie the patient. Stable cardiomegaly. Interval decrease in size of small left pleural effusion. Pulmonary vascular redistribution and mild interstitial opacities. No large area of pulmonary consolidation. Thoracic spine degenerative changes. No  pneumothorax. IMPRESSION: Interval decrease in size of small left pleural effusion with underlying opacities favored to represent atelectasis. Cardiomegaly. Pulmonary vascular redistribution and mild interstitial edema. Electronically Signed   By: Annia Belt M.D.   On: 06/11/2017 19:24    EKG:   Orders placed or performed during the hospital encounter of 06/11/17  . ED EKG  . ED EKG    IMPRESSION AND PLAN:  Principal Problem:   COPD with acute exacerbation (HCC) - IV Solu-Medrol, duo nebs, azithromycin, continue BiPAP Active Problems:   Acute respiratory failure with hypoxia and hypercapnia (HCC) - due to COPD exacerbation, though she may have some mild exacerbation of her diastolic CHF as well. O2 saturations are significantly improved on BiPAP, continue monitoring for decrease inCO2 on blood gas as well.   Acute on chronic diastolic CHF (congestive  heart failure) (HCC) - diastolic heart failure, she has only mild pulmonary edema at this time, Will hold off on diuretics for now as her blood pressure dropped somewhat she was placed on BiPAP   Alcohol abuse - CIWA protocol  All the records are reviewed and case discussed with ED provider. Management plans discussed with the patient and/or family.  DVT PROPHYLAXIS: SubQ lovenox  GI PROPHYLAXIS: None  ADMISSION STATUS: Inpatient  CODE STATUS: Full Code Status History    Date Active Date Inactive Code Status Order ID Comments User Context   04/14/2017  6:55 PM 04/18/2017  5:29 PM Full Code [id number]  Shaune Pollack, MD Inpatient   01/19/2017 11:07 PM 01/22/2017  5:36 PM Full Code [id number]  Oralia Manis, MD ED   12/06/2015  1:33 PM 12/07/2015  7:03 PM Full Code [id number]  Alford Highland, MD ED      TOTAL TIME TAKING CARE OF THIS PATIENT: 45 minutes.   Jakaden Ouzts FIELDING 06/11/2017, 8:59 PM  [employer]  [phone]  CC: Primary care physician; Patient, No Pcp Per  Note:  This document was prepared using Dragon voice recognition software and may include unintentional dictation errors.

## 2017-06-11 NOTE — Progress Notes (Signed)
eLink Physician-Brief Progress Note Patient Name: Rose Torres DOB: 1951-07-18 MRN: [id number]   Date of Service  06/11/2017  HPI/Events of Note  No patient evaluation  eICU Interventions  Nothing further to add     Intervention Category Major Interventions: Other:  Toniann Dickerson 06/11/2017, 10:30 PM

## 2017-06-11 NOTE — ED Triage Notes (Signed)
Patient from home via [facility]. Reports increasing SOB x2 days. Reports no improvement with use of home inhaler. Patient reports history of COPD and CHF. States she is not taking any medications because she "doesn't know what to take. One doctor says one thing and another says something else." reports increased swelling in feet and legs x1 week. EMS reports RA oxygen saturation of 84%. Gave 1 dose albuterol and 1 duoneb en route. Upon arrival, patient is 92% on 4L . Patient alert and oriented x4. Denies recent illness, fever or cough.

## 2017-06-11 NOTE — ED Provider Notes (Signed)
[facility] Emergency Department Provider Note ____________________________________________   First MD Initiated Contact with Patient 06/11/17 1907     (approximate)  I have reviewed the triage vital signs and the nursing notes.   HISTORY  Chief Complaint Shortness of Breath    HPI Rose Torres is a 65 y.o. female With a history of COPD, alcohol abuse, and CHF who presents with worsening shortness of breath, gradual onset over the last several weeks, associated with generalized weakness and feeling tired. Patient reports mild chronic cough, denies fever or chills, and denies any GI or urologic symptoms.  Past Medical History:  Diagnosis Date  . Alcohol abuse   . Bronchitis   . COPD (chronic obstructive pulmonary disease) (HCC)   . Diastolic CHF (HCC)   . Shortness of breath dyspnea     Patient Active Problem List   Diagnosis Date Noted  . COPD with acute exacerbation (HCC) 06/11/2017  . Acute CHF (congestive heart failure) (HCC) 04/14/2017  . Sepsis (HCC) 01/19/2017  . Gastroenteritis 01/19/2017  . COPD (chronic obstructive pulmonary disease) (HCC) 01/19/2017  . Lactic acidosis 01/19/2017  . CAP (community acquired pneumonia) 01/19/2017  . Alcohol abuse 01/19/2017  . Acute respiratory failure with hypoxia and hypercapnia (HCC) 12/06/2015    Past Surgical History:  Procedure Laterality Date  . ABDOMINAL HYSTERECTOMY    . CYST REMOVAL HAND      Prior to Admission medications   Medication Sig Start Date End Date Taking? Authorizing Provider  acetaminophen (TYLENOL) 325 MG tablet Take 2 tablets (650 mg total) by mouth every 6 (six) hours as needed for mild pain (or Fever >/= 101). 01/22/17   Adrian Saran, MD  albuterol (PROVENTIL HFA;VENTOLIN HFA) 108 (90 Base) MCG/ACT inhaler Inhale 2 puffs into the lungs every 6 (six) hours as needed for wheezing or shortness of breath. 04/18/17   Enid Baas, MD  amiodarone (PACERONE) 200 MG  tablet Take 1 tablet (200 mg total) by mouth 2 (two) times daily. 04/18/17   Enid Baas, MD  aspirin EC 81 MG EC tablet Take 1 tablet (81 mg total) by mouth daily. 04/19/17   Enid Baas, MD  budesonide-formoterol [facility]) 160-4.5 MCG/ACT inhaler Inhale 2 puffs into the lungs 2 (two) times daily. 04/18/17   Enid Baas, MD  doxycycline (VIBRA-TABS) 100 MG tablet Take 1 tablet (100 mg total) by mouth every 12 (twelve) hours. X 4 more days 04/18/17   Enid Baas, MD  furosemide (LASIX) 20 MG tablet Take 1 tablet (20 mg total) by mouth daily as needed for fluid or edema. 04/18/17 04/18/18  Enid Baas, MD  ipratropium-albuterol (DUONEB) 0.5-2.5 (3) MG/3ML SOLN Take 3 mLs by nebulization every 4 (four) hours as needed. 04/18/17   Enid Baas, MD  predniSONE (STERAPRED UNI-PAK 21 TAB) 10 MG (21) TBPK tablet 6 tabs PO x 1 day 5 tabs PO x 1 day 4 tabs PO x 1 day 3 tabs PO x 1 day 2 tabs PO x 1 day 1 tab PO x 1 day and stop 04/18/17   Enid Baas, MD  temazepam (RESTORIL) 7.5 MG capsule Take 1 capsule (7.5 mg total) by mouth at bedtime. 04/18/17   Enid Baas, MD  traMADol (ULTRAM) 50 MG tablet Take 1 tablet (50 mg total) by mouth every 6 (six) hours as needed. 04/18/17 04/18/18  Enid Baas, MD    Allergies Patient has no known allergies.  Family History  Problem Relation Age of Onset  . Healthy Mother   .  Melanoma Father     Social History Social History  Substance Use Topics  . Smoking status: Current Every Day Smoker    Packs/day: 0.50  . Smokeless tobacco: Current User  . Alcohol use No    Review of Systems  Constitutional: No fever.  Positive for generalized weakness.  Eyes: No visual changes. ENT: No sore throat. Cardiovascular: Denies chest pain. Respiratory: Positive for shortness of breath. Gastrointestinal: No nausea, no vomiting.   Genitourinary: Negative for dysuria.  Musculoskeletal: Negative for back pain. Skin: Negative  for rash. Neurological: Negative for headache.   ____________________________________________   PHYSICAL EXAM:  VITAL SIGNS: ED Triage Vitals  Enc Vitals Group     BP 06/11/17 1850 103/61     Pulse Rate 06/11/17 1850 (!) 107     Resp 06/11/17 1850 (!) 26     Temp 06/11/17 1850 98.2 F (36.8 C)     Temp Source 06/11/17 1850 Oral     SpO2 06/11/17 1850 92 %     Weight 06/11/17 1853 120 lb (54.4 kg)     Height 06/11/17 1853  (1.575 m)     Head Circumference --      Peak Flow --      Pain Score 06/11/17 1847 6     Pain Loc --      Pain Edu? --      Excl. in GC? --     Constitutional: Alert and oriented, but tired appearing.  No acute resp distress.  Eyes: Conjunctivae are normal.  Head: Atraumatic. Nose: No congestion/rhinnorhea. Mouth/Throat: Mucous membranes are moist.   Neck: Normal range of motion.  Cardiovascular: Tachycardic, regular rhythm. Grossly normal heart sounds.  Good peripheral circulation. Respiratory: Normal respiratory effort.  No retractions. Decreased BS bilat with scattered wheezes.  Gastrointestinal: Soft and nontender. No distention.  Genitourinary: No CVA tenderness. Musculoskeletal: No lower extremity edema.  Extremities warm and well perfused.  Neurologic:  Normal speech and language. No gross focal neurologic deficits are appreciated.  Motor intact in all extremities.  Skin:  Skin is warm and dry. No rash noted. Psychiatric: Mood and affect are normal.   ____________________________________________   LABS (all labs ordered are listed, but only abnormal results are displayed)  Labs Reviewed  BASIC METABOLIC PANEL - Abnormal; Notable for the following:       Result Value   Potassium 3.1 (*)    Chloride 99 (*)    Glucose, Bld 152 (*)    BUN <5 (*)    Calcium 8.2 (*)    All other components within normal limits  CBC - Abnormal; Notable for the following:    Hemoglobin 17.3 (*)    HCT 51.4 (*)    MCV 101.7 (*)    MCH 34.2 (*)     RDW 17.0 (*)    All other components within normal limits  BLOOD GAS, VENOUS - Abnormal; Notable for the following:    pCO2, Ven 82 (*)    pO2, Ven 64.0 (*)    Bicarbonate 36.8 (*)    Acid-Base Excess 5.7 (*)    All other components within normal limits  URINALYSIS, COMPLETE (UACMP) WITH MICROSCOPIC - Abnormal; Notable for the following:    Color, Urine YELLOW (*)    APPearance CLEAR (*)    Squamous Epithelial / LPF 0-5 (*)    All other components within normal limits  TROPONIN I  BRAIN NATRIURETIC PEPTIDE  ETHANOL   ____________________________________________  EKG  ED ECG REPORT I,  Dionne Bucy, the attending physician, personally viewed and interpreted this ECG.  Date: 06/11/2017 EKG Time: 1853 Rate: 107 Rhythm: sinus tachycardia QRS Axis: normal Intervals: RVH with repolarization abnormality, biatrial enlargement ST/T Wave abnormalities: diffuse T wave inversions, nonspecific Narrative Interpretation: no evidence of acute ischemia; no significant change when compared to EKG of 8-18  ____________________________________________  RADIOLOGY  CXR: mall left pleural effusion, interstitial opacities consistent with atelectasis.   ____________________________________________   PROCEDURES  Procedure(s) performed: No    Critical Care performed: Yes  CRITICAL CARE Performed by: Dionne Bucy   Total critical care time: 30 minutes  Critical care time was exclusive of separately billable procedures and treating other patients.  Critical care was necessary to treat or prevent imminent or life-threatening deterioration.  Critical care was time spent personally by me on the following activities: development of treatment plan with patient and/or surrogate as well as nursing, discussions with consultants, evaluation of patient's response to treatment, examination of patient, obtaining history from patient or surrogate, ordering and performing treatments and  interventions, ordering and review of laboratory studies, ordering and review of radiographic studies, pulse oximetry and re-evaluation of patient's condition.  ____________________________________________   INITIAL IMPRESSION / ASSESSMENT AND PLAN / ED COURSE  Pertinent labs & imaging results that were available during my care of the patient were reviewed by me and considered in my medical decision making (see chart for details).  66 year old female with history of COPD, alcohol abuse, and CHF presents with generalized weakness and worsening shortness of breath for last several weeks. On exam patient is alert but slightly sleepy appearing, she is somewhat tachycardic and hypoxic, and exam is otherwise as described. Presentation more consistent with CHF given wheeze and no rales on lung exam, and no other exam evidence of significant fluid overload. Suspect the patient's mental status is due to hypercapnia. Plan: Chest x-ray, labs, nebs, steroids, and reassess.    ----------------------------------------- 8:45 PM on 06/11/2017 -----------------------------------------  ABG consistent with COPD exacerbation with elevated CO2. Patient's hypoxia slightly worsened. Patient now started on BiPAP.  ----------------------------------------- 8:52 PM on 06/11/2017 -----------------------------------------  Signed out to hospitalist Dr. Anne Hahn.  ____________________________________________   FINAL CLINICAL IMPRESSION(S) / ED DIAGNOSES  Final diagnoses:  COPD exacerbation (HCC)  Hypoxia      NEW MEDICATIONS STARTED DURING THIS VISIT:  New Prescriptions   No medications on file     Note:  This document was prepared using Dragon voice recognition software and may include unintentional dictation errors.    Dionne Bucy, MD 06/11/17 2052

## 2017-06-11 NOTE — ED Notes (Signed)
Patient assisted to use toilet. Patient remained on 4L [place] while ambulating. Oxygen saturation dropped to 87% with 4L and became increasingly tachypnic. Patient assisted back to and placed on cardiac monitor. Oxygen increased to 6L [place]. EDP made aware.

## 2017-06-12 DIAGNOSIS — I5033 Acute on chronic diastolic (congestive) heart failure: Secondary | ICD-10-CM

## 2017-06-12 DIAGNOSIS — J441 Chronic obstructive pulmonary disease with (acute) exacerbation: Principal | ICD-10-CM

## 2017-06-12 DIAGNOSIS — J9602 Acute respiratory failure with hypercapnia: Secondary | ICD-10-CM

## 2017-06-12 DIAGNOSIS — J9601 Acute respiratory failure with hypoxia: Secondary | ICD-10-CM

## 2017-06-12 LAB — BASIC METABOLIC PANEL
ANION GAP: 11 (ref 5–15)
BUN: 5 mg/dL — ABNORMAL LOW (ref 6–20)
CALCIUM: 7.7 mg/dL — AB (ref 8.9–10.3)
CO2: 29 mmol/L (ref 22–32)
Chloride: 100 mmol/L — ABNORMAL LOW (ref 101–111)
Creatinine, Ser: 0.71 mg/dL (ref 0.44–1.00)
Glucose, Bld: 188 mg/dL — ABNORMAL HIGH (ref 65–99)
Potassium: 4.1 mmol/L (ref 3.5–5.1)
Sodium: 140 mmol/L (ref 135–145)

## 2017-06-12 LAB — CBC
HCT: 48 % — ABNORMAL HIGH (ref 35.0–47.0)
HEMOGLOBIN: 15.9 g/dL (ref 12.0–16.0)
MCH: 33.8 pg (ref 26.0–34.0)
MCHC: 33.2 g/dL (ref 32.0–36.0)
MCV: 101.9 fL — ABNORMAL HIGH (ref 80.0–100.0)
Platelets: 277 10*3/uL (ref 150–440)
RBC: 4.71 MIL/uL (ref 3.80–5.20)
RDW: 16.9 % — ABNORMAL HIGH (ref 11.5–14.5)
WBC: 6.6 10*3/uL (ref 3.6–11.0)

## 2017-06-12 MED ORDER — METHYLPREDNISOLONE SODIUM SUCC 40 MG IJ SOLR
20.0000 mg | Freq: Every day | INTRAMUSCULAR | Status: DC
  Administered 2017-06-13: 09:00:00 20 mg via INTRAVENOUS
  Filled 2017-06-12: qty 1

## 2017-06-12 NOTE — Progress Notes (Signed)
Rested on and off during shift.  A&Ox4.  o2 sats 92% on 4 L nasal cannula.  NSR per cardiac monitor. Patient is floor care status waiting for bed placement.

## 2017-06-12 NOTE — Care Management (Addendum)
Previous referral to [facility] and medication management. I have emailed [facility] and Medication Management to check patient status. RNCM consult for medication needs. Plan to transfer to floor care today. I received notification from Medication management that patient has not followed through with information needed to assist patient. There is nothing else that CM can do- patient will have to put forth effort for manage her health care- resources have been supplied.

## 2017-06-12 NOTE — Progress Notes (Signed)
Dr. Belia Heman present at bedside and gave order for patient to be moved to any floor no tele.  RN and patient spoke with MD regarding patient's pain medication for her leg pain.  Patient spoke with MD about her leg pain in and out of the hospital.  MD stated he would look at chart because patient can not remember what she takes for leg pain.

## 2017-06-12 NOTE — Progress Notes (Signed)
[employer] - [place] at [facility]                                                                                                                                                                                  Patient Demographics   Rose Torres, is a 66 y.o. female, DOB - 27-Dec-1950, [id number]  Admit date - 06/11/2017   Admitting Physician Oralia Manis, MD  Outpatient Primary MD for the patient is Patient, No Pcp Per   LOS - 1  Subjective: Patient's breathing is improved now she is on 4 L of oxygen States that she's been having shortness of breath progressively worse   Review of Systems:   CONSTITUTIONAL: No documented fever. No fatigue, weakness. No weight gain, no weight loss.  EYES: No blurry or double vision.  ENT: No tinnitus. No postnasal drip. No redness of the oropharynx.  RESPIRATORY: No cough, no wheeze, no hemoptysis. positive dyspnea.  CARDIOVASCULAR: No chest pain. No orthopnea. No palpitations. No syncope.  GASTROINTESTINAL: No nausea, no vomiting or diarrhea. No abdominal pain. No melena or hematochezia.  GENITOURINARY: No dysuria or hematuria.  ENDOCRINE: No polyuria or nocturia. No heat or cold intolerance.  HEMATOLOGY: No anemia. No bruising. No bleeding.  INTEGUMENTARY: No rashes. No lesions.  MUSCULOSKELETAL: No arthritis. No swelling. No gout.  NEUROLOGIC: No numbness, tingling, or ataxia. No seizure-type activity.  PSYCHIATRIC: No anxiety. No insomnia. No ADD.    Vitals:   Vitals:   06/12/17 0741 06/12/17 0800 06/12/17 0900 06/12/17 1000  BP:  116/68 (!) 108/53 (!) 96/59  Pulse: 81 85 93 83  Resp: Temp:      TempSrc:      SpO2: 92% 95% 92% 94%  Weight:      Height:        Wt Readings from Last 3 Encounters:  06/11/17 123 lb 0.3 oz (55.8 kg)  04/18/17 125 lb (56.7 kg)  01/19/17 122 lb 12.8 oz (55.7 kg)     Intake/Output Summary (Last 24 hours) at 06/12/17 1601 Last data filed at 06/12/17 0741  Gross  per 24 hour  Intake              900 ml  Output              150 ml  Net              750 ml    Physical Exam:   GENERAL: Pleasant-appearing in no apparent distress.  HEAD, EYES, EARS, NOSE AND THROAT: Atraumatic, normocephalic. Extraocular muscles are intact. Pupils equal and reactive to light. Sclerae anicteric. No conjunctival injection. No oro-pharyngeal erythema.  NECK:  Supple. There is no jugular venous distention. No bruits, no lymphadenopathy, no thyromegaly.  HEART: Regular rate and rhythm,. No murmurs, no rubs, no clicks.  LUNGS: occasional wheezing bilaterally no accesory muscle usage  ABDOMEN: Soft, flat, nontender, nondistended. Has good bowel sounds. No hepatosplenomegaly appreciated.  EXTREMITIES: No evidence of any cyanosis, clubbing, or peripheral edema.  +2 pedal and radial pulses bilaterally.  NEUROLOGIC: The patient is alert, awake, and oriented x3 with no focal motor or sensory deficits appreciated bilaterally.  SKIN: Moist and warm with no rashes appreciated.  Psych: Not anxious, depressed LN: No inguinal LN enlargement    Antibiotics   Anti-infectives    Start     Dose/Rate Route Frequency Ordered Stop   06/12/17 1000  azithromycin (ZITHROMAX) tablet 500 mg     500 mg Oral Daily 06/11/17 2225        Medications   Scheduled Meds: . amiodarone  200 mg Oral BID  . aspirin EC  81 mg Oral Daily  . azithromycin  500 mg Oral Daily  . enoxaparin (LOVENOX) injection  40 mg Subcutaneous Q24H  . Influenza vac split quadrivalent PF  0.5 mL Intramuscular Tomorrow-1000  . methylPREDNISolone (SOLU-MEDROL) injection  60 mg Intravenous Q6H  . mometasone-formoterol  2 puff Inhalation BID  . nicotine  7 mg Transdermal Daily   Continuous Infusions: PRN Meds:.acetaminophen **OR** acetaminophen, acetaminophen, furosemide, ipratropium-albuterol, ondansetron **OR** ondansetron (ZOFRAN) IV, traMADol   Data Review:   Micro Results Recent Results (from the past 240  hour(s))  MRSA PCR Screening     Status: None   Collection Time: 06/11/17 10:30 PM  Result Value Ref Range Status   MRSA by PCR NEGATIVE NEGATIVE Final    Comment:        The GeneXpert MRSA Assay (FDA approved for NASAL specimens only), is one component of a comprehensive MRSA colonization surveillance program. It is not intended to diagnose MRSA infection nor to guide or monitor treatment for MRSA infections.     Radiology Reports Dg Chest 2 View  Result Date: 06/11/2017 CLINICAL DATA:  Patient with shortness of breath for 2 days. EXAM: CHEST  2 VIEW COMPARISON:  Chest radiograph 04/15/2017. FINDINGS: Multiple monitoring leads overlie the patient. Stable cardiomegaly. Interval decrease in size of small left pleural effusion. Pulmonary vascular redistribution and mild interstitial opacities. No large area of pulmonary consolidation. Thoracic spine degenerative changes. No pneumothorax. IMPRESSION: Interval decrease in size of small left pleural effusion with underlying opacities favored to represent atelectasis. Cardiomegaly. Pulmonary vascular redistribution and mild interstitial edema. Electronically Signed   By: Annia Belt M.D.   On: 06/11/2017 19:24     CBC  Recent Labs Lab 06/11/17 1855 06/12/17 0511  WBC 7.9 6.6  HGB 17.3* 15.9  HCT 51.4* 48.0*  PLT 309 277  MCV 101.7* 101.9*  MCH 34.2* 33.8  MCHC 33.7 33.2  RDW 17.0* 16.9*    Chemistries   Recent Labs Lab 06/11/17 1855 06/12/17 0511  NA 144 140  K 3.1* 4.1  CL 99* 100*  CO2 31 29  GLUCOSE 152* 188*  BUN <5* 5*  CREATININE 0.62 0.71  CALCIUM 8.2* 7.7*   ------------------------------------------------------------------------------------------------------------------ estimated creatinine clearance is 56.9 mL/min (by C-G formula based on SCr of 0.71 mg/dL). ------------------------------------------------------------------------------------------------------------------ No results for input(s): HGBA1C in  the last 72 hours. ------------------------------------------------------------------------------------------------------------------ No results for input(s): CHOL, HDL, LDLCALC, TRIG, CHOLHDL, LDLDIRECT in the last 72 hours. ------------------------------------------------------------------------------------------------------------------ No results for input(s): TSH, T4TOTAL, T3FREE, THYROIDAB in the last 72 hours.  Invalid input(s): FREET3 ------------------------------------------------------------------------------------------------------------------ No results for input(s): VITAMINB12, FOLATE, FERRITIN, TIBC, IRON, RETICCTPCT in the last 72 hours.  Coagulation profile No results for input(s): INR, PROTIME in the last 168 hours.  No results for input(s): DDIMER in the last 72 hours.  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 (HCC) - IV Solu-Medrol, duo nebs, azithromycin, continue BiPAP Acute respiratory failure with hypoxia and hypercapnia (HCC) - due to COPD exacerbation, continue oxygen therapy Acute on chronic diastolic CHF (congestive heart failure) (HCC) - diastolic heart failure, she has only mild pulmonary edema  Consider oral Lasix  Alcohol abuse - CIWA protocol Nicotine abuse smoking cession provided 60m in spent, nicotine patch recommend     Code Status Orders        Start     Ordered   06/11/17 2225  Full code  Continuous     06/11/17 2225    Code Status History    Date Active Date Inactive Code Status Order ID Comments User Context   04/14/2017  6:55 PM 04/18/2017  5:29 PM Full Code [id number]  Shaune Pollack, MD Inpatient   01/19/2017 11:07 PM 01/22/2017  5:36 PM Full Code [id number]  Oralia Manis, MD ED   12/06/2015  1:33 PM 12/07/2015  7:03 PM Full Code [id number]  Alford Highland, MD ED           Consults  ccm  DVT Prophylaxis  Lovenox   Lab Results  Component Value Date   PLT 277 06/12/2017     Time Spent in minutes  Greater than 50% of time spent in care coordination and counseling patient regarding the condition and plan of care.   Auburn Bilberry M.D on 06/12/2017 at 4:01 PM  Between 7am to 6pm - Pager - [phone]  After 6pm go to [email or web address] - password [email or web address] [facility]  [facility] [place] [facility]   Office  [phone]

## 2017-06-13 LAB — BLOOD GAS, VENOUS
Acid-Base Excess: 5.7 mmol/L — ABNORMAL HIGH (ref 0.0–2.0)
BICARBONATE: 36.8 mmol/L — AB (ref 20.0–28.0)
FIO2: 0.4
LHR: 26 {breaths}/min
O2 SAT: 88.5 %
Patient temperature: 37
pCO2, Ven: 82 mmHg (ref 44.0–60.0)
pH, Ven: 7.26 (ref 7.250–7.430)
pO2, Ven: 64 mmHg — ABNORMAL HIGH (ref 32.0–45.0)

## 2017-06-13 LAB — BASIC METABOLIC PANEL
ANION GAP: 8 (ref 5–15)
BUN: 16 mg/dL (ref 6–20)
CHLORIDE: 99 mmol/L — AB (ref 101–111)
CO2: 30 mmol/L (ref 22–32)
Calcium: 8.3 mg/dL — ABNORMAL LOW (ref 8.9–10.3)
Creatinine, Ser: 0.52 mg/dL (ref 0.44–1.00)
GFR calc Af Amer: >60 mL/min (ref >60)
GFR calc non Af Amer: >60 mL/min (ref >60)
GLUCOSE: 143 mg/dL — AB (ref 65–99)
POTASSIUM: 4.8 mmol/L (ref 3.5–5.1)
Sodium: 137 mmol/L (ref 135–145)

## 2017-06-13 LAB — ALBUMIN: Albumin: 2.7 g/dL — ABNORMAL LOW (ref 3.5–5.0)

## 2017-06-13 MED ORDER — INFLUENZA VAC SPLIT QUAD 0.5 ML IM SUSY: 0.5000 mL | PREFILLED_SYRINGE | INTRAMUSCULAR | Status: DC | PRN

## 2017-06-13 MED ORDER — KETOROLAC TROMETHAMINE 15 MG/ML IJ SOLN
15.0000 mg | Freq: Four times a day (QID) | INTRAMUSCULAR | Status: DC | PRN
  Administered 2017-06-13 – 2017-06-14 (×3): 15 mg via INTRAVENOUS
  Filled 2017-06-13 (×4): qty 1

## 2017-06-13 MED ORDER — INFLUENZA VAC SPLIT QUAD 0.5 ML IM SUSY: 0.5000 mL | PREFILLED_SYRINGE | Freq: Once | INTRAMUSCULAR | Status: DC

## 2017-06-13 MED ORDER — KETOROLAC TROMETHAMINE 15 MG/ML IJ SOLN
15.0000 mg | Freq: Four times a day (QID) | INTRAMUSCULAR | Status: DC | PRN
  Administered 2017-06-13: 15 mg via INTRAVENOUS
  Filled 2017-06-13 (×2): qty 1

## 2017-06-13 MED ORDER — BUDESONIDE 0.25 MG/2ML IN SUSP
0.2500 mg | Freq: Two times a day (BID) | RESPIRATORY_TRACT | Status: DC
  Administered 2017-06-13 – 2017-06-15 (×4): 0.25 mg via RESPIRATORY_TRACT
  Filled 2017-06-13 (×4): qty 2

## 2017-06-13 MED ORDER — METHYLPREDNISOLONE SODIUM SUCC 40 MG IJ SOLR
20.0000 mg | Freq: Two times a day (BID) | INTRAMUSCULAR | Status: DC
  Administered 2017-06-13 – 2017-06-15 (×4): 20 mg via INTRAVENOUS
  Filled 2017-06-13 (×4): qty 1

## 2017-06-13 MED ORDER — ALPRAZOLAM 0.5 MG PO TABS
0.2500 mg | ORAL_TABLET | Freq: Three times a day (TID) | ORAL | Status: DC | PRN
  Administered 2017-06-13 – 2017-06-15 (×5): 0.25 mg via ORAL
  Filled 2017-06-13 (×5): qty 1

## 2017-06-13 MED ORDER — BUPROPION HCL ER (XL) 150 MG PO TB24
150.0000 mg | ORAL_TABLET | Freq: Every day | ORAL | Status: DC
  Administered 2017-06-13 – 2017-06-14 (×2): 150 mg via ORAL
  Filled 2017-06-13 (×2): qty 1

## 2017-06-13 MED ORDER — FUROSEMIDE 10 MG/ML IJ SOLN
20.0000 mg | Freq: Two times a day (BID) | INTRAMUSCULAR | Status: DC
  Administered 2017-06-13 – 2017-06-15 (×5): 20 mg via INTRAVENOUS
  Filled 2017-06-13 (×6): qty 2

## 2017-06-13 MED ORDER — VENLAFAXINE HCL ER 75 MG PO CP24
75.0000 mg | ORAL_CAPSULE | Freq: Every day | ORAL | Status: DC
  Administered 2017-06-14 – 2017-06-15 (×2): 75 mg via ORAL
  Filled 2017-06-13 (×2): qty 1

## 2017-06-13 NOTE — Progress Notes (Signed)
[employer] - [place] at [facility]                                                                                                                                                                                  Patient Demographics   Rose Torres, is a 66 y.o. female, DOB - October 12, 1950, [id number]  Admit date - 06/11/2017   Admitting Physician Oralia Manis, MD  Outpatient Primary MD for the patient is Patient, No Pcp Per   LOS - 2  Subjective: Patient's breathing is improved. She complains of feeling a little anxious and depressed  Review of Systems:   CONSTITUTIONAL: No documented fever. No fatigue, weakness. No weight gain, no weight loss.  EYES: No blurry or double vision.  ENT: No tinnitus. No postnasal drip. No redness of the oropharynx.  RESPIRATORY: ositive cough, no wheeze, no hemoptysis. positive dyspnea.  CARDIOVASCULAR: No chest pain. No orthopnea. No palpitations. No syncope.  GASTROINTESTINAL: No nausea, no vomiting or diarrhea. No abdominal pain. No melena or hematochezia.  GENITOURINARY: No dysuria or hematuria.  ENDOCRINE: No polyuria or nocturia. No heat or cold intolerance.  HEMATOLOGY: No anemia. No bruising. No bleeding.  INTEGUMENTARY: No rashes. No lesions.  MUSCULOSKELETAL: No arthritis. No swelling. No gout.  NEUROLOGIC: No numbness, tingling, or ataxia. No seizure-type activity.  PSYCHIATRIC: positive anxiety. No insomnia. No ADD.    Vitals:   Vitals:   06/13/17 0000 06/13/17 0026 06/13/17 0518 06/13/17 0900  BP: 130/73 125/69 125/72   Pulse: 75 79 74 72  Resp: Temp:  97.6 F (36.4 C) 97.7 F (36.5 C) 98 F (36.7 C)  TempSrc:  Oral Oral Oral  SpO2: 93% 98% 94% 97%  Weight:  126 lb 6 oz (57.3 kg)    Height:   (1.549 m)      Wt Readings from Last 3 Encounters:  06/13/17 126 lb 6 oz (57.3 kg)  04/18/17 125 lb (56.7 kg)  01/19/17 122 lb 12.8 oz (55.7 kg)     Intake/Output Summary (Last 24 hours) at  06/13/17 1309 Last data filed at 06/13/17 0924  Gross per 24 hour  Intake              240 ml  Output                0 ml  Net              240 ml    Physical Exam:   GENERAL: Pleasant-appearing in no apparent distress.  HEAD, EYES, EARS, NOSE AND THROAT: Atraumatic, normocephalic. Extraocular muscles are intact. Pupils equal and reactive to light. Sclerae anicteric. No  conjunctival injection. No oro-pharyngeal erythema.  NECK: Supple. There is no jugular venous distention. No bruits, no lymphadenopathy, no thyromegaly.  HEART: Regular rate and rhythm,. No murmurs, no rubs, no clicks.  LUNGS: occasional wheezing bilaterally no accesory muscle usage  ABDOMEN: Soft, flat, nontender, nondistended. Has good bowel sounds. No hepatosplenomegaly appreciated.  EXTREMITIES: No evidence of any cyanosis, clubbing, or peripheral edema.  +2 pedal and radial pulses bilaterally.  NEUROLOGIC: The patient is alert, awake, and oriented x3 with no focal motor or sensory deficits appreciated bilaterally.  SKIN: Moist and warm with no rashes appreciated.  Psych: Not anxious, depressed LN: No inguinal LN enlargement    Antibiotics   Anti-infectives    Start     Dose/Rate Route Frequency Ordered Stop   06/12/17 1000  azithromycin (ZITHROMAX) tablet 500 mg     500 mg Oral Daily 06/11/17 2225        Medications   Scheduled Meds: . amiodarone  200 mg Oral BID  . aspirin EC  81 mg Oral Daily  . azithromycin  500 mg Oral Daily  . buPROPion  150 mg Oral Daily  . enoxaparin (LOVENOX) injection  40 mg Subcutaneous Q24H  . furosemide  20 mg Intravenous Q12H  . Influenza vac split quadrivalent PF  0.5 mL Intramuscular Once  . methylPREDNISolone (SOLU-MEDROL) injection  20 mg Intravenous Daily  . mometasone-formoterol  2 puff Inhalation BID  . nicotine  7 mg Transdermal Daily  . [START ON 06/14/2017] venlafaxine XR  75 mg Oral Q breakfast   Continuous Infusions: PRN Meds:.acetaminophen **OR**  acetaminophen, ipratropium-albuterol, ketorolac, ondansetron **OR** ondansetron (ZOFRAN) IV, traMADol   Data Review:   Micro Results Recent Results (from the past 240 hour(s))  MRSA PCR Screening     Status: None   Collection Time: 06/11/17 10:30 PM  Result Value Ref Range Status   MRSA by PCR NEGATIVE NEGATIVE Final    Comment:        The GeneXpert MRSA Assay (FDA approved for NASAL specimens only), is one component of a comprehensive MRSA colonization surveillance program. It is not intended to diagnose MRSA infection nor to guide or monitor treatment for MRSA infections.     Radiology Reports Dg Chest 2 View  Result Date: 06/11/2017 CLINICAL DATA:  Patient with shortness of breath for 2 days. EXAM: CHEST  2 VIEW COMPARISON:  Chest radiograph 04/15/2017. FINDINGS: Multiple monitoring leads overlie the patient. Stable cardiomegaly. Interval decrease in size of small left pleural effusion. Pulmonary vascular redistribution and mild interstitial opacities. No large area of pulmonary consolidation. Thoracic spine degenerative changes. No pneumothorax. IMPRESSION: Interval decrease in size of small left pleural effusion with underlying opacities favored to represent atelectasis. Cardiomegaly. Pulmonary vascular redistribution and mild interstitial edema. Electronically Signed   By: Annia Belt M.D.   On: 06/11/2017 19:24     CBC  Recent Labs Lab 06/11/17 1855 06/12/17 0511  WBC 7.9 6.6  HGB 17.3* 15.9  HCT 51.4* 48.0*  PLT 309 277  MCV 101.7* 101.9*  MCH 34.2* 33.8  MCHC 33.7 33.2  RDW 17.0* 16.9*    Chemistries   Recent Labs Lab 06/11/17 1855 06/12/17 0511 06/13/17 0524  NA 144 140 137  K 3.1* 4.1 4.8  CL 99* 100* 99*  CO2 GLUCOSE 152* 188* 143*  BUN <5* 5* 16  CREATININE 0.62 0.71 0.52  CALCIUM 8.2* 7.7* 8.3*   ------------------------------------------------------------------------------------------------------------------ estimated creatinine  clearance is 54.3 mL/min (by C-G formula based on  SCr of 0.52 mg/dL). ------------------------------------------------------------------------------------------------------------------ No results for input(s): HGBA1C in the last 72 hours. ------------------------------------------------------------------------------------------------------------------ No results for input(s): CHOL, HDL, LDLCALC, TRIG, CHOLHDL, LDLDIRECT in the last 72 hours. ------------------------------------------------------------------------------------------------------------------ No results for input(s): TSH, T4TOTAL, T3FREE, THYROIDAB in the last 72 hours.  Invalid input(s): FREET3 ------------------------------------------------------------------------------------------------------------------ No results for input(s): VITAMINB12, FOLATE, FERRITIN, TIBC, IRON, RETICCTPCT in the last 72 hours.  Coagulation profile No results for input(s): INR, PROTIME in the last 168 hours.  No results for input(s): DDIMER in the last 72 hours.  Cardiac Enzymes  Recent Labs Lab 06/11/17 1855  TROPONINI <0.03   ------------------------------------------------------------------------------------------------------------------ Invalid input(s): POCBNP    Assessment & Plan  Patient is a 66 year old with acute respiratory failure #1Acute on chronicCOPD  exacerbation (HCC) - continueIV Solu-Medrol, duo nebs, azithromycin, continue wean oxygen as tolerated #2Acute respiratory failure with hypoxia and hypercapnia (HCC) - due to COPD exacerbation, continue oxygen therapy #3Acute on chronic diastolic CHF (congestive heart failure) (HCC) - diastolic heart failure, change to IV Lasix low-dose twice a day #4 depression start therapy with albuterol and Effexor which patient was taking before #5Alcohol abuse - CIWA protocol #6Nicotine abuse smoking cession provided 61m in spent, nicotine patch recommend     Code Status Orders         Start     Ordered   06/11/17 2225  Full code  Continuous     06/11/17 2225    Code Status History    Date Active Date Inactive Code Status Order ID Comments User Context   04/14/2017  6:55 PM 04/18/2017  5:29 PM Full Code [id number]  Shaune Pollack, MD Inpatient   01/19/2017 11:07 PM 01/22/2017  5:36 PM Full Code [id number]  Oralia Manis, MD ED   12/06/2015  1:33 PM 12/07/2015  7:03 PM Full Code [id number]  Alford Highland, MD ED           Consults  ccm  DVT Prophylaxis  Lovenox   Lab Results  Component Value Date   PLT 277 06/12/2017     Time Spent in minutes  Greater than 50% of time spent in care coordination and counseling patient regarding the condition and plan of care.   Auburn Bilberry M.D on 06/13/2017 at 1:09 PM  Between 7am to 6pm - Pager - [phone]  After 6pm go to [email or web address] - password [email or web address] [facility]  [facility] [place] [facility]   Office  [phone]

## 2017-06-14 ENCOUNTER — Inpatient Hospital Stay: Payer: Medicaid Other

## 2017-06-14 DIAGNOSIS — F331 Major depressive disorder, recurrent, moderate: Secondary | ICD-10-CM

## 2017-06-14 LAB — BASIC METABOLIC PANEL
ANION GAP: 7 (ref 5–15)
BUN: 25 mg/dL — ABNORMAL HIGH (ref 6–20)
CHLORIDE: 100 mmol/L — AB (ref 101–111)
CO2: 31 mmol/L (ref 22–32)
Calcium: 8.3 mg/dL — ABNORMAL LOW (ref 8.9–10.3)
Creatinine, Ser: 0.58 mg/dL (ref 0.44–1.00)
GFR calc non Af Amer: >60 mL/min (ref >60)
Glucose, Bld: 120 mg/dL — ABNORMAL HIGH (ref 65–99)
POTASSIUM: 4.2 mmol/L (ref 3.5–5.1)
SODIUM: 138 mmol/L (ref 135–145)

## 2017-06-14 MED ORDER — BUSPIRONE HCL 5 MG PO TABS
5.0000 mg | ORAL_TABLET | Freq: Three times a day (TID) | ORAL | Status: DC
  Administered 2017-06-14 – 2017-06-15 (×3): 5 mg via ORAL
  Filled 2017-06-14 (×5): qty 1

## 2017-06-14 MED ORDER — ZOLPIDEM TARTRATE 5 MG PO TABS
5.0000 mg | ORAL_TABLET | Freq: Every evening | ORAL | Status: DC | PRN
  Administered 2017-06-14: 23:00:00 5 mg via ORAL
  Filled 2017-06-14: qty 1

## 2017-06-14 MED ORDER — OXYCODONE-ACETAMINOPHEN 5-325 MG PO TABS
1.0000 | ORAL_TABLET | Freq: Four times a day (QID) | ORAL | Status: DC | PRN
  Administered 2017-06-14 – 2017-06-15 (×4): 1 via ORAL
  Filled 2017-06-14 (×4): qty 1

## 2017-06-14 NOTE — Progress Notes (Addendum)
Medications administered by student RN [id number] with supervision of Clinical Instructor Veyda Kaufman MSN, RN-BC or patient's assigned RN.   

## 2017-06-14 NOTE — Progress Notes (Signed)
[employer] - [place] at [facility]                                                                                                                                                                                  Patient Demographics   Rose Torres, is a 66 y.o. female, DOB - [birth year], ZOX:[id number]  Admit date - 06/11/2017   Admitting Physician Oralia Manis, MD  Outpatient Primary MD for the patient is Patient, No Pcp Per   LOS - 3  Subjective: Patient states that she did not sleep well still feeling anxious. Oxygen requirement less  Review of Systems:   CONSTITUTIONAL: No documented fever. No fatigue, weakness. No weight gain, no weight loss.  EYES: No blurry or double vision.  ENT: No tinnitus. No postnasal drip. No redness of the oropharynx.  RESPIRATORY: p ositive cough, no wheeze, no hemoptysis. positive dyspnea.  CARDIOVASCULAR: No chest pain. No orthopnea. No palpitations. No syncope.  GASTROINTESTINAL: No nausea, no vomiting or diarrhea. No abdominal pain. No melena or hematochezia.  GENITOURINARY: No dysuria or hematuria.  ENDOCRINE: No polyuria or nocturia. No heat or cold intolerance.  HEMATOLOGY: No anemia. No bruising. No bleeding.  INTEGUMENTARY: No rashes. No lesions.  MUSCULOSKELETAL: No arthritis. No swelling. No gout.  NEUROLOGIC: No numbness, tingling, or ataxia. No seizure-type activity.  PSYCHIATRIC: positive anxiety. No insomnia. No ADD.    Vitals:   Vitals:   06/14/17 0732 06/14/17 0738 06/14/17 0928 06/14/17 1109  BP: 136/72  109/62   Pulse: 63  75   Resp: 16     Temp: 97.6 F (36.4 C)     TempSrc: Oral     SpO2: 97% 98%  92%  Weight:      Height:        Wt Readings from Last 3 Encounters:  06/14/17 126 lb (57.2 kg)  04/18/17 125 lb (56.7 kg)  01/19/17 122 lb 12.8 oz (55.7 kg)     Intake/Output Summary (Last 24 hours) at 06/14/17 1332 Last data filed at 06/14/17 0953  Gross per 24 hour  Intake             1210 ml   Output                0 ml  Net             1210 ml    Physical Exam:   GENERAL: Pleasant-appearing in no apparent distress.  HEAD, EYES, EARS, NOSE AND THROAT: Atraumatic, normocephalic. Extraocular muscles are intact. Pupils equal and reactive to light. Sclerae anicteric. No conjunctival injection. No oro-pharyngeal erythema.  NECK: Supple. There is no jugular venous distention. No bruits,  no lymphadenopathy, no thyromegaly.  HEART: Regular rate and rhythm,. No murmurs, no rubs, no clicks.  LUNGS: occasional wheezing bilaterally no accesory muscle usage  ABDOMEN: Soft, flat, nontender, nondistended. Has good bowel sounds. No hepatosplenomegaly appreciated.  EXTREMITIES: No evidence of any cyanosis, clubbing, or peripheral edema.  +2 pedal and radial pulses bilaterally.  NEUROLOGIC: The patient is alert, awake, and oriented x3 with no focal motor or sensory deficits appreciated bilaterally.  SKIN: Moist and warm with no rashes appreciated.  Psych: Not anxious, depressed LN: No inguinal LN enlargement    Antibiotics   Anti-infectives    Start     Dose/Rate Route Frequency Ordered Stop   06/12/17 1000  azithromycin (ZITHROMAX) tablet 500 mg     500 mg Oral Daily 06/11/17 2225 06/14/17 0925      Medications   Scheduled Meds: . amiodarone  200 mg Oral BID  . aspirin EC  81 mg Oral Daily  . budesonide (PULMICORT) nebulizer solution  0.25 mg Nebulization BID  . buPROPion  150 mg Oral Daily  . enoxaparin (LOVENOX) injection  40 mg Subcutaneous Q24H  . furosemide  20 mg Intravenous Q12H  . methylPREDNISolone (SOLU-MEDROL) injection  20 mg Intravenous Q12H  . nicotine  7 mg Transdermal Daily  . venlafaxine XR  75 mg Oral Q breakfast   Continuous Infusions: PRN Meds:.acetaminophen **OR** acetaminophen, ALPRAZolam, Influenza vac split quadrivalent PF, ipratropium-albuterol, ketorolac, ondansetron **OR** ondansetron (ZOFRAN) IV, oxyCODONE-acetaminophen, zolpidem   Data Review:    Micro Results Recent Results (from the past 240 hour(s))  MRSA PCR Screening     Status: None   Collection Time: 06/11/17 10:30 PM  Result Value Ref Range Status   MRSA by PCR NEGATIVE NEGATIVE Final    Comment:        The GeneXpert MRSA Assay (FDA approved for NASAL specimens only), is one component of a comprehensive MRSA colonization surveillance program. It is not intended to diagnose MRSA infection nor to guide or monitor treatment for MRSA infections.     Radiology Reports Dg Chest 2 View  Result Date: 06/14/2017 CLINICAL DATA:  The patient reports shortness of breath. History of CHF, COPD, current smoker. EXAM: CHEST  2 VIEW COMPARISON:  PA and lateral chest x-ray of June 11, 2017 FINDINGS: The lungs remain hyperinflated. There is chronic blunting of the costophrenic angles. The interstitial markings are coarse. The cardiac silhouette is enlarged. The pulmonary vascularity is mildly prominent centrally but there is no definite cephalization. There is calcification in the wall of the aortic arch. The bony thorax exhibits no acute abnormality. IMPRESSION: COPD. Chronic interstitial prominence likely reflects the patient's smoking history. No pneumonia. Cardiomegaly without pulmonary vascular congestion or pulmonary edema. Thoracic aortic atherosclerosis. Electronically Signed   By: David  [place] M.D.   On: 06/14/2017 12:16   Dg Chest 2 View  Result Date: 06/11/2017 CLINICAL DATA:  Patient with shortness of breath for 2 days. EXAM: CHEST  2 VIEW COMPARISON:  Chest radiograph 04/15/2017. FINDINGS: Multiple monitoring leads overlie the patient. Stable cardiomegaly. Interval decrease in size of small left pleural effusion. Pulmonary vascular redistribution and mild interstitial opacities. No large area of pulmonary consolidation. Thoracic spine degenerative changes. No pneumothorax. IMPRESSION: Interval decrease in size of small left pleural effusion with underlying opacities  favored to represent atelectasis. Cardiomegaly. Pulmonary vascular redistribution and mild interstitial edema. Electronically Signed   By: Annia Belt M.D.   On: 06/11/2017 19:24     CBC  Recent Labs Lab 06/11/17 1855  06/12/17 0511  WBC 7.9 6.6  HGB 17.3* 15.9  HCT 51.4* 48.0*  PLT 309 277  MCV 101.7* 101.9*  MCH 34.2* 33.8  MCHC 33.7 33.2  RDW 17.0* 16.9*    Chemistries   Recent Labs Lab 06/11/17 1855 06/12/17 0511 06/13/17 0524 06/14/17 0541  NA 144 140 137 138  K 3.1* 4.1 4.8 4.2  CL 99* 100* 99* 100*  CO2 GLUCOSE 152* 188* 143* 120*  BUN <5* 5* 16 25*  CREATININE 0.62 0.71 0.52 0.58  CALCIUM 8.2* 7.7* 8.3* 8.3*   ------------------------------------------------------------------------------------------------------------------ estimated creatinine clearance is 54.3 mL/min (by C-G formula based on SCr of 0.58 mg/dL). ------------------------------------------------------------------------------------------------------------------ No results for input(s): HGBA1C in the last 72 hours. ------------------------------------------------------------------------------------------------------------------ No results for input(s): CHOL, HDL, LDLCALC, TRIG, CHOLHDL, LDLDIRECT in the last 72 hours. ------------------------------------------------------------------------------------------------------------------ No results for input(s): TSH, T4TOTAL, T3FREE, THYROIDAB in the last 72 hours.  Invalid input(s): FREET3 ------------------------------------------------------------------------------------------------------------------ No results for input(s): VITAMINB12, FOLATE, FERRITIN, TIBC, IRON, RETICCTPCT in the last 72 hours.  Coagulation profile No results for input(s): INR, PROTIME in the last 168 hours.  No results for input(s): DDIMER in the last 72 hours.  Cardiac Enzymes  Recent Labs Lab 06/11/17 1855  TROPONINI <0.03    ------------------------------------------------------------------------------------------------------------------ Invalid input(s): POCBNP    Assessment & Plan  Patient is a 66 year old with acute respiratory failure #1Acute on chronicCOPD  exacerbation (HCC) - continueIV Solu-Medrol, duo nebs, azithromycin, continue wean oxygen as tolerated, repeat chest x-ray today #2Acute respiratory failure with hypoxia and hypercapnia (HCC) - due to COPD exacerbation, continue oxygen therapy wean as tolerated #3Acute on chronic diastolic CHF (congestive heart failure) (HCC) - diastolic heart failure,continue iv lasix #4 depression ,wellburin and Effexor which patient was taking before, I ask pych to see #5Alcohol abuse - CIWA protocol no evidence of withdrawal #6Nicotine abuse smoking cession provided 67m in spent, nicotine patch recommend   Plan for discharge tomorrow     Code Status Orders        Start     Ordered   06/11/17 2225  Full code  Continuous     06/11/17 2225    Code Status History    Date Active Date Inactive Code Status Order ID Comments User Context   04/14/2017  6:55 PM 04/18/2017  5:29 PM Full Code [id number]  Shaune Pollack, MD Inpatient   01/19/2017 11:07 PM 01/22/2017  5:36 PM Full Code [id number]  Oralia Manis, MD ED   12/06/2015  1:33 PM 12/07/2015  7:03 PM Full Code [id number]  Alford Highland, MD ED           Consults  ccm  DVT Prophylaxis  Lovenox   Lab Results  Component Value Date   PLT 277 06/12/2017     Time Spent in minutes  Greater than 50% of time spent in care coordination and counseling patient regarding the condition and plan of care.   Auburn Bilberry M.D on 06/14/2017 at 1:32 PM  Between 7am to 6pm - Pager - [phone]  After 6pm go to [email or web address] - password [email or web address] [facility]  [facility]   Office  [phone]

## 2017-06-14 NOTE — Consult Note (Signed)
[facility] Consult   Reason for Consult:  Consult for 66 year old woman currently in the hospital with worsening COPD. Concern about depression and anxiety Referring Physician:  Posey Pronto Patient Identification: Rose Torres MRN:  [id number] Principal Diagnosis: Moderate recurrent major depression ([place]) Diagnosis:   Patient Active Problem List   Diagnosis Date Noted  . Moderate recurrent major depression ([place]) [F33.1] 06/14/2017  . COPD with acute exacerbation ([place]) [J44.1] 06/11/2017  . Acute on chronic diastolic CHF (congestive heart failure) ([place]) [I50.33] 04/14/2017  . Sepsis ([place]) [A41.9] 01/19/2017  . Gastroenteritis [K52.9] 01/19/2017  . COPD (chronic obstructive pulmonary disease) ([place]) [J44.9] 01/19/2017  . Lactic acidosis [E87.2] 01/19/2017  . CAP (community acquired pneumonia) [J18.9] 01/19/2017  . Alcohol abuse [F10.10] 01/19/2017  . Acute respiratory failure with hypoxia and hypercapnia (HCC) [J96.01, J96.02] 12/06/2015    Total Time spent with patient: 1 hour  Subjective:   Rose Torres is a 66 y.o. female patient admitted with "I am not feeling well".  HPI:  Patient seen chart reviewed. Patient is in the hospital because of worsening shortness of breath. As far as her mental health problems however she readily admits that her anxiety has been getting much worse. She feels nervous almost all of the time. Sometimes it will come and go. Additionally she feels depressed and sad almost all of the time. Her energy level is very poor. Feels exhausted all the time. Sleeps very poorly. She is up until 2 in the morning and then wakes frequently at night. She has lost interest in most of her normal activities and has greatly cut back all of her usual activities. She does not report psychotic symptoms. She has had some hopeless thoughts but no intention of suicide. Patient admits that she still drinks regularly drinking probably to of the medium to large  size beers per day. Alcohol level was elevated on admission. She is not currently receiving any outpatient psychiatric treatment. Patient's major stress is that her husband died in 08-Apr-2023 of this year. Prior to that her mother had died in 11/08/22. Patient has not been able to work since 08-Nov-2022.  Medical history: Patient has COPD. Gastroenteritis  Social history: Lives by herself. Has an adult child that she sees fairly regularly. 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 Husband died this summer.  Substance abuse history: Patient admits she has had problems with alcohol in the past. Years ago when she was living in [place] she had a spell of drinking too heavily. No history of seizures or DTs. Recently she says she is drinking only 2 beers a day but admits that it has probably been not good for her. No history of other drug abuse  Past Psychiatric History: Patient has no prior suicide attempts. She did have 1 psychiatric hospitalization in [place] years ago. She went through a spell of major depression in [place] several years ago and says she was treated with Effexor and BuSpar. No history of mania and no history of psychosis.  Risk to Self: Is patient at risk for suicide?: No Risk to Others:   Prior Inpatient Therapy:   Prior Outpatient Therapy:    Past Medical History:  Past Medical History:  Diagnosis Date  . Alcohol abuse   . Bronchitis   . COPD (chronic obstructive pulmonary disease) ([place])   . Diastolic CHF ([place])   . Shortness of breath dyspnea     Past Surgical History:  Procedure Laterality Date  .  ABDOMINAL HYSTERECTOMY    . CYST REMOVAL HAND     Family History:  Family History  Problem Relation Age of Onset  . Healthy Mother   . Melanoma Father    Family Psychiatric  History: She had a grandfather with major depression Social History:  History  Alcohol Use No     History  Drug Use No    Social History   Social History  . Marital  status: Widowed    Spouse name: N/A  . Number of children: N/A  . Years of education: N/A   Social History Main Topics  . Smoking status: Current Every Day Smoker    Packs/day: 0.50  . Smokeless tobacco: Current User  . Alcohol use No  . Drug use: No  . Sexual activity: Not Asked   Other Topics Concern  . None   Social History Narrative  . None   Additional Social History:    Allergies:  No Known Allergies  Labs:  Results for orders placed or performed during the hospital encounter of 06/11/17 (from the past 48 hour(s))  Basic metabolic panel     Status: Abnormal   Collection Time: 06/13/17  5:24 AM  Result Value Ref Range   Sodium 137 135 - 145 mmol/L   Potassium 4.8 3.5 - 5.1 mmol/L   Chloride 99 (L) 101 - 111 mmol/L   CO2 30 22 - 32 mmol/L   Glucose, Bld 143 (H) 65 - 99 mg/dL   BUN 16 6 - 20 mg/dL   Creatinine, Ser 0.52 0.44 - 1.00 mg/dL   Calcium 8.3 (L) 8.9 - 10.3 mg/dL   GFR calc non Af Amer >60 >60 mL/min   GFR calc Af Amer >60 >60 mL/min    Comment: (NOTE) The eGFR has been calculated using the CKD EPI equation. This calculation has not been validated in all clinical situations. eGFR's persistently <60 mL/min signify possible Chronic Kidney Disease.    Anion gap 8 5 - 15  Albumin     Status: Abnormal   Collection Time: 06/13/17  5:24 AM  Result Value Ref Range   Albumin 2.7 (L) 3.5 - 5.0 g/dL  Basic metabolic panel     Status: Abnormal   Collection Time: 06/14/17  5:41 AM  Result Value Ref Range   Sodium 138 135 - 145 mmol/L   Potassium 4.2 3.5 - 5.1 mmol/L   Chloride 100 (L) 101 - 111 mmol/L   CO2 31 22 - 32 mmol/L   Glucose, Bld 120 (H) 65 - 99 mg/dL   BUN 25 (H) 6 - 20 mg/dL   Creatinine, Ser 0.58 0.44 - 1.00 mg/dL   Calcium 8.3 (L) 8.9 - 10.3 mg/dL   GFR calc non Af Amer >60 >60 mL/min   GFR calc Af Amer >60 >60 mL/min    Comment: (NOTE) The eGFR has been calculated using the CKD EPI equation. This calculation has not been validated in all  clinical situations. eGFR's persistently <60 mL/min signify possible Chronic Kidney Disease.    Anion gap 7 5 - 15    Current Facility-Administered Medications  Medication Dose Route Frequency Provider Last Rate Last Dose  . acetaminophen (TYLENOL) tablet 650 mg  650 mg Oral Q6H PRN Lance Coon, MD   650 mg at 06/14/17 0144   Or  . acetaminophen (TYLENOL) suppository 650 mg  650 mg Rectal Q6H PRN Lance Coon, MD      . ALPRAZolam Duanne Moron) tablet 0.25 mg  0.25 mg Oral  TID PRN Dustin Flock, MD   0.25 mg at 06/14/17 0944  . amiodarone (PACERONE) tablet 200 mg  200 mg Oral BID Lance Coon, MD   200 mg at 06/14/17 0925  . aspirin EC tablet 81 mg  81 mg Oral Daily Lance Coon, MD   81 mg at 06/14/17 8657  . budesonide (PULMICORT) nebulizer solution 0.25 mg  0.25 mg Nebulization BID Dustin Flock, MD   0.25 mg at 06/14/17 0738  . busPIRone (BUSPAR) tablet 5 mg  5 mg Oral TID Clapacs, John T, MD      . enoxaparin (LOVENOX) injection 40 mg  40 mg Subcutaneous Q24H Lance Coon, MD   40 mg at 06/13/17 2323  . furosemide (LASIX) injection 20 mg  20 mg Intravenous Q12H Dustin Flock, MD   20 mg at 06/14/17 1407  . Influenza vac split quadrivalent PF (FLUARIX) injection 0.5 mL  0.5 mL Intramuscular Prior to discharge Dustin Flock, MD      . ipratropium-albuterol (DUONEB) 0.5-2.5 (3) MG/3ML nebulizer solution 3 mL  3 mL Nebulization Q4H PRN Lance Coon, MD      . ketorolac (TORADOL) 15 MG/ML injection 15 mg  15 mg Intravenous Q6H PRN Dustin Flock, MD   15 mg at 06/14/17 1432  . methylPREDNISolone sodium succinate (SOLU-MEDROL) 40 mg/mL injection 20 mg  20 mg Intravenous Q12H Dustin Flock, MD   20 mg at 06/14/17 0926  . nicotine (NICODERM CQ - dosed in mg/24 hr) patch 7 mg  7 mg Transdermal Daily Varughese, Bincy S, NP   7 mg at 06/14/17 0929  . ondansetron (ZOFRAN) tablet 4 mg  4 mg Oral Q6H PRN Lance Coon, MD       Or  . ondansetron [facility]) injection 4 mg  4 mg Intravenous  Q6H PRN Lance Coon, MD      . oxyCODONE-acetaminophen (PERCOCET/ROXICET) 5-325 MG per tablet 1 tablet  1 tablet Oral Q6H PRN Dustin Flock, MD   1 tablet at 06/14/17 1137  . venlafaxine XR (EFFEXOR-XR) 24 hr capsule 75 mg  75 mg Oral Q breakfast Dustin Flock, MD   75 mg at 06/14/17 0925  . zolpidem (AMBIEN) tablet 5 mg  5 mg Oral QHS PRN Dustin Flock, MD        Musculoskeletal: Strength & Muscle Tone: decreased Gait & Station: unsteady Patient leans: N/A  Psychiatric Specialty Exam: Physical Exam  Nursing note and vitals reviewed. Constitutional: She appears well-developed. She appears distressed.  HENT:  Head: Normocephalic and atraumatic.  Eyes: Pupils are equal, round, and reactive to light. Conjunctivae are normal.  Neck: Normal range of motion.  Cardiovascular: Regular rhythm and normal heart sounds.   Respiratory: She is in respiratory distress.  GI: Soft.  Musculoskeletal: Normal range of motion.  Neurological: She is alert.  Skin: Skin is warm and dry.  Psychiatric: Judgment normal. Her mood appears anxious. Her speech is delayed. She is slowed. Thought content is not paranoid. She exhibits a depressed mood. She expresses no homicidal and no suicidal ideation. She exhibits abnormal recent memory.    Review of Systems  Constitutional: Positive for weight loss.  HENT: Negative.   Eyes: Negative.   Respiratory: Positive for shortness of breath.   Cardiovascular: Negative.   Gastrointestinal: Negative.   Musculoskeletal: Negative.   Skin: Negative.   Neurological: Positive for weakness.  Psychiatric/Behavioral: Positive for depression and substance abuse. Negative for hallucinations, memory loss and suicidal ideas. The patient is nervous/anxious and has insomnia.  Blood pressure 130/78, pulse 75, temperature 97.9 F (36.6 C), temperature source Oral, resp. rate 14, height '5\' 1"'$  (1.549 m), weight 57.2 kg (126 lb), SpO2 96 %.Body mass index is 23.81 kg/m.   General Appearance: Casual  Eye Contact:  Fair  Speech:  Normal Rate  Volume:  Normal  Mood:  Depressed  Affect:  Depressed and Tearful  Thought Process:  Goal Directed  Orientation:  Full (Time, Place, and Person)  Thought Content:  Logical  Suicidal Thoughts:  No  Homicidal Thoughts:  No  Memory:  Immediate;   Good Recent;   Fair Remote;   Fair  Judgement:  Fair  Insight:  Fair  Psychomotor Activity:  Decreased  Concentration:  Concentration: Fair  Recall:  [employer] of Knowledge:  Fair  Language:  Fair  Akathisia:  No  Handed:  Right  AIMS (if indicated):     Assets:  Communication Skills Desire for Improvement Housing Social Support  ADL's:  Intact  Cognition:  WNL  Sleep:        Treatment Plan Summary: Daily contact with patient to assess and evaluate symptoms and progress in treatment, Medication management and Plan 66 year old woman with depression and alcohol abuse. Patient had been started back on medicine already. She was started on Effexor and Wellbutrin but she is telling me that what she actually took in the past was Effexor and BuSpar. I have discontinue the Wellbutrin and replaced it with BuSpar which makes more sense with her anxiety. Patient is on a low dose of Xanax which I think is reasonable while she is in the hospital but I would discourage her being prescribed any at discharge. I have put in a social work consult to request that somebody give her information about outpatient mental health referral. She really needs to be seeing a therapist. Counseling done about alcohol abuse and strongly encouraged her to cut back and probably stop her alcohol use. I will follow-up as needed.  Disposition: No evidence of imminent risk to self or others at present.   Patient does not meet criteria for psychiatric inpatient admission. Supportive therapy provided about ongoing stressors. Discussed crisis plan, support from social network, calling 911, coming to the  Emergency Department, and calling Suicide Hotline.  Alethia Berthold, MD 06/14/2017 6:16 PM

## 2017-06-14 NOTE — Progress Notes (Signed)
Pnt had uneventful evening last night. Pnt medicated for chronic back pain and leg pain per orders. Pnt did fall asleep late this AM. No new issues or concerns. Bed low and locked, call bell in reach.

## 2017-06-15 MED ORDER — ZOLPIDEM TARTRATE 5 MG PO TABS: 5.0000 mg | ORAL_TABLET | Freq: Every evening | ORAL | 0 refills | Status: AC | PRN

## 2017-06-15 MED ORDER — PREDNISONE 10 MG (21) PO TBPK: ORAL_TABLET | ORAL | 0 refills | Status: AC

## 2017-06-15 MED ORDER — ALPRAZOLAM 0.25 MG PO TABS: 0.2500 mg | ORAL_TABLET | Freq: Three times a day (TID) | ORAL | 0 refills | Status: AC | PRN

## 2017-06-15 MED ORDER — VENLAFAXINE HCL ER 75 MG PO CP24: 75.0000 mg | ORAL_CAPSULE | Freq: Every day | ORAL | 0 refills | Status: AC

## 2017-06-15 MED ORDER — BUSPIRONE HCL 5 MG PO TABS: 5.0000 mg | ORAL_TABLET | Freq: Three times a day (TID) | ORAL | 0 refills | Status: AC

## 2017-06-15 MED ORDER — OXYCODONE-ACETAMINOPHEN 5-325 MG PO TABS: 1.0000 | ORAL_TABLET | Freq: Four times a day (QID) | ORAL | 0 refills | Status: AC | PRN

## 2017-06-15 NOTE — Care Management (Addendum)
Discharge to home today per Dr. Allena Katz. Discussed that she would need to [facility]. Will get inhalers labeled for discharge. Already has nebulizer.  Discussed that Smiley Houseman from [facility] will be calling for an appointment. Rose Torres's contact # [phone]. Home oxygen per [facility] since March 2018. Feliberto Gottron,  [facility] representative, will bring tank for discharge. States a friend will transport. Gwenette Greet RN MSN CCM Care Management [phone]

## 2017-06-15 NOTE — Progress Notes (Signed)
SATURATION QUALIFICATIONS: (This note is used to comply with regulatory documentation for home oxygen)  Patient Saturations on Room Air at Rest = 87%  Patient Saturations on Room Air while Ambulating = 82%  Patient Saturations on 2 Liters of oxygen while Ambulating = 92-93%  Please briefly explain why patient needs home oxygen: COPD

## 2017-06-15 NOTE — Discharge Summary (Signed)
[facility] - [place] at [facility], 66 y.o., DOB November 29, 1950, MRN [id number]. Admission date: 06/11/2017 Discharge Date 06/15/2017 Primary MD Patient, No Pcp Per Admitting Physician Oralia Manis, MD      Admission Diagnosis  Hypoxia [R09.02] COPD exacerbation [facility]) [J44.1]  Discharge Diagnosis   Principal Problem: Acute on chronic COPD exasperation   Acute respiratory failure with hypoxia and hypercapnia Acute on chronic diastolic CHF Moderate recurrent major depression (HCC) Alcohol abuse    depression  Nicotine abuse     Hospital Course  Rose Torres a 66 y.o.femalewho presents with significant shortness of breath and decreased mental status. Patient has a history of COPD and CHF, on workup here in the ED seems that she is in more COPD exacerbation. She required BiPAP in the ED due to significant hypercapnia, as well as some mild hypoxia. hospitalists were called for admission.  Patient was kept in ICU and subsequently taken off BiPAP and transferred to the floor. Patient has chronic oxygen at home she will need to continue to wear this 24 hours. She was treated for COPD exasperation. Patient also was depressed and therefore was seen by psychiatry medications were changed. She is doing much better and is stable for discharge.           Consults  pulmonary/intensive care, pulmonary                    Significant Tests:  See full reports for all details     Dg Chest 2 View  Result Date: 06/14/2017 CLINICAL DATA:  The patient reports shortness of breath. History of CHF, COPD, current smoker. EXAM: CHEST  2 VIEW COMPARISON:  PA and lateral chest x-ray of June 11, 2017 FINDINGS: The lungs remain hyperinflated. There is chronic blunting of the costophrenic angles. The interstitial markings are coarse. The cardiac silhouette is enlarged. The pulmonary vascularity is mildly prominent centrally but  there is no definite cephalization. There is calcification in the wall of the aortic arch. The bony thorax exhibits no acute abnormality. IMPRESSION: COPD. Chronic interstitial prominence likely reflects the patient's smoking history. No pneumonia. Cardiomegaly without pulmonary vascular congestion or pulmonary edema. Thoracic aortic atherosclerosis. Electronically Signed   By: David  Swaziland M.D.   On: 06/14/2017 12:16   Dg Chest 2 View  Result Date: 06/11/2017 CLINICAL DATA:  Patient with shortness of breath for 2 days. EXAM: CHEST  2 VIEW COMPARISON:  Chest radiograph 04/15/2017. FINDINGS: Multiple monitoring leads overlie the patient. Stable cardiomegaly. Interval decrease in size of small left pleural effusion. Pulmonary vascular redistribution and mild interstitial opacities. No large area of pulmonary consolidation. Thoracic spine degenerative changes. No pneumothorax. IMPRESSION: Interval decrease in size of small left pleural effusion with underlying opacities favored to represent atelectasis. Cardiomegaly. Pulmonary vascular redistribution and mild interstitial edema. Electronically Signed   By: Annia Belt M.D.   On: 06/11/2017 19:24       Today   Subjective:   Rose Torres  Patient feeling much better  Objective:   Blood pressure 111/63, pulse 72, temperature 97.6 F (36.4 C), temperature source Oral, resp. rate 14, height  (1.549 m), weight 127 lb 11.2 oz (57.9 kg), SpO2 94 %.  .  Intake/Output Summary (Last 24 hours) at 06/15/17 1435 Last data filed at 06/15/17 1340  Gross per 24 hour  Intake              720 ml  Output  0 ml  Net              720 ml    Exam VITAL SIGNS: Blood pressure 111/63, pulse 72, temperature 97.6 F (36.4 C), temperature source Oral, resp. rate 14, height  (1.549 m), weight 127 lb 11.2 oz (57.9 kg), SpO2 94 %.  GENERAL:  66 y.o.-year-old patient lying in the bed with no acute distress.  EYES: Pupils equal, round, reactive  to light and accommodation. No scleral icterus. Extraocular muscles intact.  HEENT: Head atraumatic, normocephalic. Oropharynx and nasopharynx clear.  NECK:  Supple, no jugular venous distention. No thyroid enlargement, no tenderness.  LUNGS: Normal breath sounds bilaterally, no wheezing, rales,rhonchi or crepitation. No use of accessory muscles of respiration.  CARDIOVASCULAR: S1, S2 normal. No murmurs, rubs, or gallops.  ABDOMEN: Soft, nontender, nondistended. Bowel sounds present. No organomegaly or mass.  EXTREMITIES: No pedal edema, cyanosis, or clubbing.  NEUROLOGIC: Cranial nerves II through XII are intact. Muscle strength 5/5 in all extremities. Sensation intact. Gait not checked.  PSYCHIATRIC: The patient is alert and oriented x 3.  SKIN: No obvious rash, lesion, or ulcer.   Data Review     CBC w Diff: Lab Results  Component Value Date   WBC 6.6 06/12/2017   HGB 15.9 06/12/2017   HCT 48.0 (H) 06/12/2017   PLT 277 06/12/2017   LYMPHOPCT 4 01/19/2017   MONOPCT 8 01/19/2017   EOSPCT 0 01/19/2017   BASOPCT 0 01/19/2017   CMP: Lab Results  Component Value Date   NA 138 06/14/2017   K 4.2 06/14/2017   CL 100 (L) 06/14/2017   CO2 31 06/14/2017   BUN 25 (H) 06/14/2017   CREATININE 0.58 06/14/2017   PROT 6.3 (L) 04/14/2017   ALBUMIN 2.7 (L) 06/13/2017   BILITOT 0.5 04/14/2017   ALKPHOS 140 (H) 04/14/2017   AST 24 04/14/2017   ALT 13 (L) 04/14/2017  .  Micro Results Recent Results (from the past 240 hour(s))  MRSA PCR Screening     Status: None   Collection Time: 06/11/17 10:30 PM  Result Value Ref Range Status   MRSA by PCR NEGATIVE NEGATIVE Final    Comment:        The GeneXpert MRSA Assay (FDA approved for NASAL specimens only), is one component of a comprehensive MRSA colonization surveillance program. It is not intended to diagnose MRSA infection nor to guide or monitor treatment for MRSA infections.         Code Status Orders        Start      Ordered   06/11/17 2225  Full code  Continuous     06/11/17 2225    Code Status History    Date Active Date Inactive Code Status Order ID Comments User Context   04/14/2017  6:55 PM 04/18/2017  5:29 PM Full Code [id number]  Shaune Pollack, MD Inpatient   01/19/2017 11:07 PM 01/22/2017  5:36 PM Full Code [id number]  Oralia Manis, MD ED   12/06/2015  1:33 PM 12/07/2015  7:03 PM Full Code [id number]  Alford Highland, MD ED          Follow-up Information    [facility] Follow up on 06/20/2017.   Specialty:  Cardiology Why:  at 9:00am Contact information: [address] [place] [address] [phone]       [facility]).   Why:  f/u primary  Discharge Medications   Allergies as of 06/15/2017   No Known Allergies     Medication List    STOP taking these medications   temazepam 7.5 MG capsule Commonly known as:  RESTORIL     TAKE these medications   acetaminophen 325 MG tablet Commonly known as:  TYLENOL Take 2 tablets (650 mg total) by mouth every 6 (six) hours as needed for mild pain (or Fever >/= 101).   albuterol 108 (90 Base) MCG/ACT inhaler Commonly known as:  PROVENTIL HFA;VENTOLIN HFA Inhale 2 puffs into the lungs every 6 (six) hours as needed for wheezing or shortness of breath.   ALPRAZolam 0.25 MG tablet Commonly known as:  XANAX Take 1 tablet (0.25 mg total) by mouth 3 (three) times daily as needed for anxiety.   amiodarone 200 MG tablet Commonly known as:  PACERONE Take 1 tablet (200 mg total) by mouth 2 (two) times daily.   aspirin 81 MG EC tablet Take 1 tablet (81 mg total) by mouth daily.   budesonide-formoterol 160-4.5 MCG/ACT inhaler Commonly known as:  SYMBICORT Inhale 2 puffs into the lungs 2 (two) times daily.   busPIRone 5 MG tablet Commonly known as:  BUSPAR Take 1 tablet (5 mg total) by mouth 3 (three) times daily.   furosemide 20 MG  tablet Commonly known as:  LASIX Take 1 tablet (20 mg total) by mouth daily as needed for fluid or edema.   ipratropium-albuterol 0.5-2.5 (3) MG/3ML Soln Commonly known as:  DUONEB Take 3 mLs by nebulization every 4 (four) hours as needed.   oxyCODONE-acetaminophen 5-325 MG tablet Commonly known as:  PERCOCET/ROXICET Take 1 tablet by mouth every 6 (six) hours as needed for moderate pain or severe pain.   predniSONE 10 MG (21) Tbpk tablet Commonly known as:  STERAPRED UNI-PAK 21 TAB 6 tabs PO x 1 day 5 tabs PO x 1 day 4 tabs PO x 1 day 3 tabs PO x 1 day 2 tabs PO x 1 day 1 tab PO x 1 day and stop   venlafaxine XR 75 MG 24 hr capsule Commonly known as:  EFFEXOR-XR Take 1 capsule (75 mg total) by mouth daily with breakfast.   zolpidem 5 MG tablet Commonly known as:  AMBIEN Take 1 tablet (5 mg total) by mouth at bedtime as needed for sleep.            Discharge Care Instructions        Start     Ordered   06/16/17 0000  venlafaxine XR (EFFEXOR-XR) 75 MG 24 hr capsule  Daily with breakfast     06/15/17 1111   06/15/17 0000  ALPRAZolam (XANAX) 0.25 MG tablet  3 times daily PRN     06/15/17 1111   06/15/17 0000  busPIRone (BUSPAR) 5 MG tablet  3 times daily     06/15/17 1111   06/15/17 0000  predniSONE (STERAPRED UNI-PAK 21 TAB) 10 MG (21) TBPK tablet     06/15/17 1111   06/15/17 0000  zolpidem (AMBIEN) 5 MG tablet  At bedtime PRN     06/15/17 1111   06/15/17 0000  oxyCODONE-acetaminophen (PERCOCET/ROXICET) 5-325 MG tablet  Every 6 hours PRN     06/15/17 1111         Total Time in preparing paper work, data evaluation and todays exam - 35 minutes  Auburn Bilberry M.D on 06/15/2017 at 2:35 PM  [facility]   Office  [phone]

## 2017-06-15 NOTE — Clinical Social Work Note (Signed)
Clinical Social Work Assessment  Patient Details  Name: Rose Torres MRN: [id number] Date of Birth: September 08, 1951  Date of referral:  06/15/17               Reason for consult:  Mental Health Concerns                Permission sought to share information with:    Permission granted to share information::     Name::        Agency::     Relationship::     Contact Information:     Housing/Transportation Living arrangements for the past 2 months:  Single Family Home Source of Information:  Patient Patient Interpreter Needed:  None Criminal Activity/Legal Involvement Pertinent to Current Situation/Hospitalization:  No - Comment as needed Significant Relationships:  Adult Children Lives with:  Self Do you feel safe going back to the place where you live?  Yes Need for family participation in patient care:  Yes (Comment)  Care giving concerns:  Patient lives alone in [place].    Social Worker assessment / plan:  Holiday representative (CSW) received consult from Psych MD requesting for CSW to provide patient with outpatient mental health resource list. Patient has been diagnosed with moderate recurrent major depression. CSW met with patient alone at bedside to address consult. Patient was alert and oriented X4 and was sitting up in the bed watching TV. CSW introduced self and explained role of CSW department. Patient reported that she lives alone in [place] and her adult son Rose Torres is her HPOA and primary support. CSW provided patient with a list of outpatient mental health resources including [facility] and [employer]. Patient accepted resources and thanked CSW for visit. Patient reported that she does have a car however she has not been driving because she has been weak. CSW provided emotional support. Patient reported no other needs or concerns at this time. Please reconsult if future social work needs arise. CSW signing off.    Employment status:  Disabled (Comment on whether or not  currently receiving Disability) Insurance information:  Self Pay (Medicaid Pending) PT Recommendations:  Not assessed at this time Information / Referral to community resources:  Outpatient Psychiatric Care (Comment Required) ([facility] )  Patient/Family's Response to care:  Patient accepted outpatient mental health resource list.   Patient/Family's Understanding of and Emotional Response to Diagnosis, Current Treatment, and Prognosis:  Patient was very pleasant and thanked CSW for assistance.   Emotional Assessment Appearance:  Appears stated age Attitude/Demeanor/Rapport:    Affect (typically observed):  Accepting, Adaptable, Pleasant Orientation:  Oriented to Self, Oriented to Place, Oriented to  Time, Oriented to Situation Alcohol / Substance use:  Not Applicable Psych involvement (Current and /or in the community):  Yes (Comment) (Psych MD requested CSW to give patient outpatient mental health resources. )  Discharge Needs  Concerns to be addressed:  Discharge Planning Concerns Readmission within the last 30 days:  No Current discharge risk:  Psychiatric Illness Barriers to Discharge:  No Barriers Identified   Lesly Pontarelli, Veronia Beets, LCSW 06/15/2017, 12:31 PM

## 2017-06-15 NOTE — Progress Notes (Deleted)
[facility] - [place] at [facility], 66 y.o., DOB May 20, 1951, MRN [id number]. Admission date: 06/11/2017 Discharge Date 06/15/2017 Primary MD Patient, No Pcp Per Admitting Physician Rose Manis, MD  Admission Diagnosis  Hypoxia [R09.02] COPD exacerbation (HCC) [J44.1]  Discharge Diagnosis   Principal Problem: Acute on chronic COPD exasperation   Acute respiratory failure with hypoxia and hypercapnia Acute on chronic diastolic CHF Moderate recurrent major depression (HCC) Alcohol abuse    depression  Nicotine abuse     Hospital Course  Rose Torres  is a 66 y.o. female who presents with significant shortness of breath and decreased mental status. Patient has a history of COPD and CHF, on workup here in the ED seems that she is in more COPD exacerbation. She required BiPAP in the ED due to significant hypercapnia, as well as some mild hypoxia. hospitalists were called for admission.  Patient was kept in ICU and subsequently taken off BiPAP and transferred to the floor. Patient has chronic oxygen at home she will need to continue to wear this 24 hours. She was treated for COPD exasperation. Patient also was depressed and therefore was seen by psychiatry medications were changed. She is doing much better and is stable for discharge.           Consults  pulmonary/intensive care, pulmonary  Significant Tests:  See full reports for all details     Dg Chest 2 View  Result Date: 06/14/2017 CLINICAL DATA:  The patient reports shortness of breath. History of CHF, COPD, current smoker. EXAM: CHEST  2 VIEW COMPARISON:  PA and lateral chest x-ray of June 11, 2017 FINDINGS: The lungs remain hyperinflated. There is chronic blunting of the costophrenic angles. The interstitial markings are coarse. The cardiac silhouette is enlarged. The pulmonary vascularity is mildly prominent centrally but there is no definite cephalization. There is calcification  in the wall of the aortic arch. The bony thorax exhibits no acute abnormality. IMPRESSION: COPD. Chronic interstitial prominence likely reflects the patient's smoking history. No pneumonia. Cardiomegaly without pulmonary vascular congestion or pulmonary edema. Thoracic aortic atherosclerosis. Electronically Signed   By: David  Swaziland M.D.   On: 06/14/2017 12:16   Dg Chest 2 View  Result Date: 06/11/2017 CLINICAL DATA:  Patient with shortness of breath for 2 days. EXAM: CHEST  2 VIEW COMPARISON:  Chest radiograph 04/15/2017. FINDINGS: Multiple monitoring leads overlie the patient. Stable cardiomegaly. Interval decrease in size of small left pleural effusion. Pulmonary vascular redistribution and mild interstitial opacities. No large area of pulmonary consolidation. Thoracic spine degenerative changes. No pneumothorax. IMPRESSION: Interval decrease in size of small left pleural effusion with underlying opacities favored to represent atelectasis. Cardiomegaly. Pulmonary vascular redistribution and mild interstitial edema. Electronically Signed   By: Annia Belt M.D.   On: 06/11/2017 19:24       Today   Subjective:   Rose Torres  Patient feeling much better  Objective:   Blood pressure 111/63, pulse 72, temperature 97.6 F (36.4 C), temperature source Oral, resp. rate 14, height  (1.549 m), weight 127 lb 11.2 oz (57.9 kg), SpO2 94 %.  .  Intake/Output Summary (Last 24 hours) at 06/15/17 1429 Last data filed at 06/15/17 1340  Gross per 24 hour  Intake              720 ml  Output                0 ml  Net  720 ml    Exam VITAL SIGNS: Blood pressure 111/63, pulse 72, temperature 97.6 F (36.4 C), temperature source Oral, resp. rate 14, height  (1.549 m), weight 127 lb 11.2 oz (57.9 kg), SpO2 94 %.  GENERAL:  66 y.o.-year-old patient lying in the bed with no acute distress.  EYES: Pupils equal, round, reactive to light and accommodation. No scleral icterus.  Extraocular muscles intact.  HEENT: Head atraumatic, normocephalic. Oropharynx and nasopharynx clear.  NECK:  Supple, no jugular venous distention. No thyroid enlargement, no tenderness.  LUNGS: Normal breath sounds bilaterally, no wheezing, rales,rhonchi or crepitation. No use of accessory muscles of respiration.  CARDIOVASCULAR: S1, S2 normal. No murmurs, rubs, or gallops.  ABDOMEN: Soft, nontender, nondistended. Bowel sounds present. No organomegaly or mass.  EXTREMITIES: No pedal edema, cyanosis, or clubbing.  NEUROLOGIC: Cranial nerves II through XII are intact. Muscle strength 5/5 in all extremities. Sensation intact. Gait not checked.  PSYCHIATRIC: The patient is alert and oriented x 3.  SKIN: No obvious rash, lesion, or ulcer.   Data Review     CBC w Diff: Lab Results  Component Value Date   WBC 6.6 06/12/2017   HGB 15.9 06/12/2017   HCT 48.0 (H) 06/12/2017   PLT 277 06/12/2017   LYMPHOPCT 4 01/19/2017   MONOPCT 8 01/19/2017   EOSPCT 0 01/19/2017   BASOPCT 0 01/19/2017   CMP: Lab Results  Component Value Date   NA 138 06/14/2017   K 4.2 06/14/2017   CL 100 (L) 06/14/2017   CO2 31 06/14/2017   BUN 25 (H) 06/14/2017   CREATININE 0.58 06/14/2017   PROT 6.3 (L) 04/14/2017   ALBUMIN 2.7 (L) 06/13/2017   BILITOT 0.5 04/14/2017   ALKPHOS 140 (H) 04/14/2017   AST 24 04/14/2017   ALT 13 (L) 04/14/2017  .  Micro Results Recent Results (from the past 240 hour(s))  MRSA PCR Screening     Status: None   Collection Time: 06/11/17 10:30 PM  Result Value Ref Range Status   MRSA by PCR NEGATIVE NEGATIVE Final    Comment:        The GeneXpert MRSA Assay (FDA approved for NASAL specimens only), is one component of a comprehensive MRSA colonization surveillance program. It is not intended to diagnose MRSA infection nor to guide or monitor treatment for MRSA infections.         Code Status Orders        Start     Ordered   06/11/17 2225  Full code  Continuous      06/11/17 2225    Code Status History    Date Active Date Inactive Code Status Order ID Comments User Context   04/14/2017  6:55 PM 04/18/2017  5:29 PM Full Code [id number]  Shaune Pollack, MD Inpatient   01/19/2017 11:07 PM 01/22/2017  5:36 PM Full Code [id number]  Rose Manis, MD ED   12/06/2015  1:33 PM 12/07/2015  7:03 PM Full Code [id number]  Alford Highland, MD ED          Follow-up Information    [facility] Follow up on 06/20/2017.   Specialty:  Cardiology Why:  at 9:00am Contact information: [address] [place] [address] [phone]       [facility]).   Why:  f/u primary          Discharge Medications   Allergies as of 06/15/2017  No Known Allergies     Medication List    STOP taking these medications   temazepam 7.5 MG capsule Commonly known as:  RESTORIL     TAKE these medications   acetaminophen 325 MG tablet Commonly known as:  TYLENOL Take 2 tablets (650 mg total) by mouth every 6 (six) hours as needed for mild pain (or Fever >/= 101).   albuterol 108 (90 Base) MCG/ACT inhaler Commonly known as:  PROVENTIL HFA;VENTOLIN HFA Inhale 2 puffs into the lungs every 6 (six) hours as needed for wheezing or shortness of breath.   ALPRAZolam 0.25 MG tablet Commonly known as:  XANAX Take 1 tablet (0.25 mg total) by mouth 3 (three) times daily as needed for anxiety.   amiodarone 200 MG tablet Commonly known as:  PACERONE Take 1 tablet (200 mg total) by mouth 2 (two) times daily.   aspirin 81 MG EC tablet Take 1 tablet (81 mg total) by mouth daily.   budesonide-formoterol 160-4.5 MCG/ACT inhaler Commonly known as:  SYMBICORT Inhale 2 puffs into the lungs 2 (two) times daily.   busPIRone 5 MG tablet Commonly known as:  BUSPAR Take 1 tablet (5 mg total) by mouth 3 (three) times daily.   furosemide 20 MG tablet Commonly known as:  LASIX Take 1 tablet  (20 mg total) by mouth daily as needed for fluid or edema.   ipratropium-albuterol 0.5-2.5 (3) MG/3ML Soln Commonly known as:  DUONEB Take 3 mLs by nebulization every 4 (four) hours as needed.   oxyCODONE-acetaminophen 5-325 MG tablet Commonly known as:  PERCOCET/ROXICET Take 1 tablet by mouth every 6 (six) hours as needed for moderate pain or severe pain.   predniSONE 10 MG (21) Tbpk tablet Commonly known as:  STERAPRED UNI-PAK 21 TAB 6 tabs PO x 1 day 5 tabs PO x 1 day 4 tabs PO x 1 day 3 tabs PO x 1 day 2 tabs PO x 1 day 1 tab PO x 1 day and stop   venlafaxine XR 75 MG 24 hr capsule Commonly known as:  EFFEXOR-XR Take 1 capsule (75 mg total) by mouth daily with breakfast.   zolpidem 5 MG tablet Commonly known as:  AMBIEN Take 1 tablet (5 mg total) by mouth at bedtime as needed for sleep.            Discharge Care Instructions        Start     Ordered   06/16/17 0000  venlafaxine XR (EFFEXOR-XR) 75 MG 24 hr capsule  Daily with breakfast     06/15/17 1111   06/15/17 0000  ALPRAZolam (XANAX) 0.25 MG tablet  3 times daily PRN     06/15/17 1111   06/15/17 0000  busPIRone (BUSPAR) 5 MG tablet  3 times daily     06/15/17 1111   06/15/17 0000  predniSONE (STERAPRED UNI-PAK 21 TAB) 10 MG (21) TBPK tablet     06/15/17 1111   06/15/17 0000  zolpidem (AMBIEN) 5 MG tablet  At bedtime PRN     06/15/17 1111   06/15/17 0000  oxyCODONE-acetaminophen (PERCOCET/ROXICET) 5-325 MG tablet  Every 6 hours PRN     06/15/17 1111         Total Time in preparing paper work, data evaluation and todays exam - 35 minutes  Auburn Bilberry M.D on 06/15/2017 at 2:29 [facility]  [facility]   Office  [phone]

## 2017-06-15 NOTE — Progress Notes (Signed)
Received MD order to discharge patient to home, reviewed prescriptions, home meds, follow up appointment and educational info on home oxygen and pursed lip breathing for shortness of breath with patient and patient verbalized understanding

## 2017-06-20 ENCOUNTER — Ambulatory Visit: Payer: Self-pay | Admitting: Family

## 2017-06-20 ENCOUNTER — Telehealth: Payer: Self-pay | Admitting: Family

## 2017-06-20 NOTE — Telephone Encounter (Signed)
Patient missed her initial appointment at [facility] on 06/20/17. Will attempt to reschedule.   This is the 2nd appointment that she has missed.

## 2017-06-28 ENCOUNTER — Telehealth: Payer: Self-pay | Admitting: Pharmacy Technician

## 2017-06-28 NOTE — Telephone Encounter (Signed)
Contacted patient about [facility] providing medication assistance.  Mailed patient a new patient packet.  Sherilyn Dacosta Care Manager [facility]

## 2017-07-19 DEATH — deceased

## 2018-10-14 IMAGING — DX DG CHEST 1V PORT
1 series · 1 of 1 positions shown · non-contrast
Comparison: CT chest 01/19/2017; chest radiograph 01/19/2017.

CLINICAL DATA: Patient with shortness of breath and hypoxia.

EXAM:
PORTABLE CHEST 1 VIEW

[chest ap]
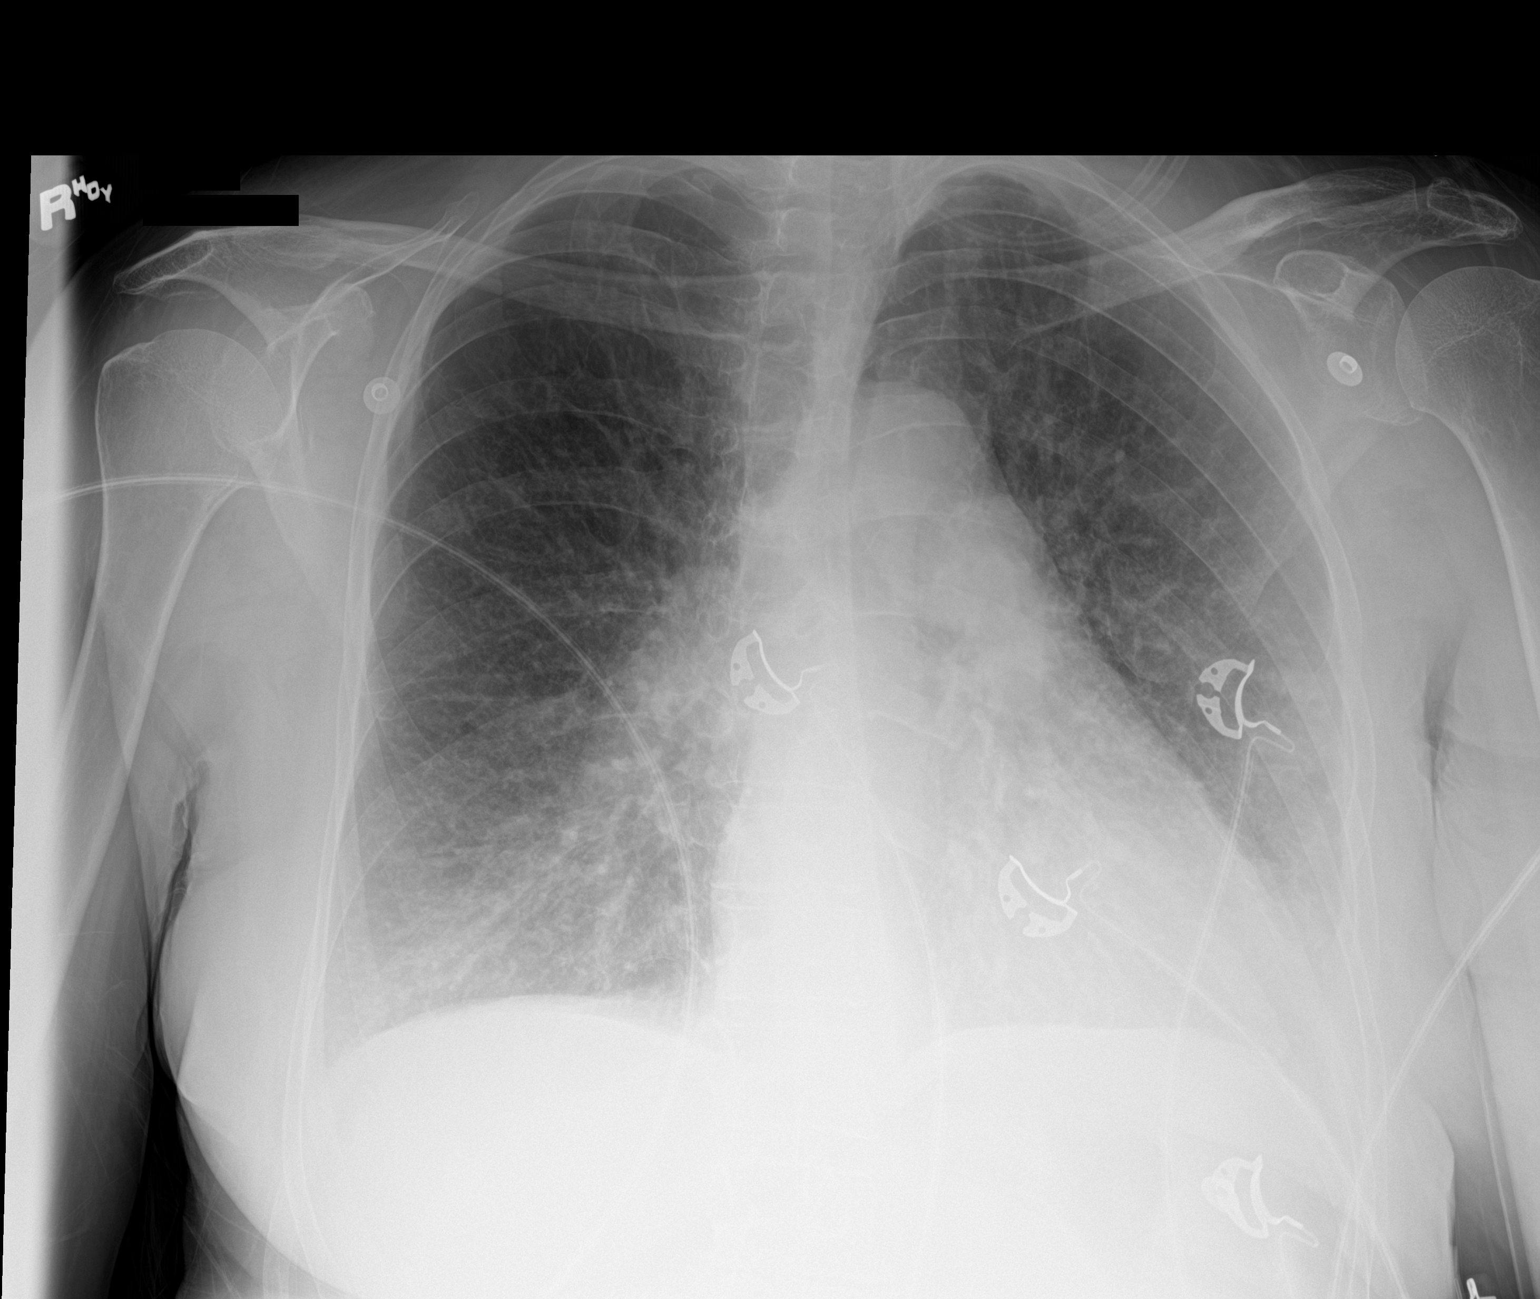

[1 of 1 positions shown; findings below may reference images not displayed]

FINDINGS: Monitoring leads overlie the patient. Stable cardiomegaly. Interval
worsening bilateral interstitial pulmonary opacities. Trace
bilateral pleural effusions.
IMPRESSION: Cardiomegaly and mild interstitial edema.
